# Patient Record
Sex: Female | Born: 1945 | Race: White | Hispanic: No
Health system: Southern US, Community
[De-identification: ages and names within clinical notes are randomized; demographics above are authoritative.]

## PROBLEM LIST (undated history)

## (undated) DIAGNOSIS — Z9889 Other specified postprocedural states: Secondary | ICD-10-CM

## (undated) DIAGNOSIS — J45909 Unspecified asthma, uncomplicated: Secondary | ICD-10-CM

## (undated) DIAGNOSIS — M1990 Unspecified osteoarthritis, unspecified site: Secondary | ICD-10-CM

## (undated) DIAGNOSIS — E039 Hypothyroidism, unspecified: Secondary | ICD-10-CM

## (undated) DIAGNOSIS — T8859XA Other complications of anesthesia, initial encounter: Secondary | ICD-10-CM

## (undated) DIAGNOSIS — R112 Nausea with vomiting, unspecified: Secondary | ICD-10-CM

## (undated) HISTORY — PX: CHOLECYSTECTOMY: SHX55

## (undated) HISTORY — PX: NASAL SINUS SURGERY: SHX719

## (undated) HISTORY — PX: OTHER SURGICAL HISTORY: SHX169

---

## 2019-11-23 ENCOUNTER — Ambulatory Visit: Payer: Self-pay | Attending: Internal Medicine

## 2019-11-23 DIAGNOSIS — Z23 Encounter for immunization: Secondary | ICD-10-CM

## 2019-11-23 NOTE — Progress Notes (Signed)
   Covid-19 Vaccination Clinic  Name:  Melissa Irwin    MRN: [id number] DOB: April 07, 1945  11/23/2019  [name] was observed post Covid-19 immunization for 30 minutes based on pre-vaccination screening without incident. She was provided with Vaccine Information Sheet and instruction to access the V-Safe system.   [name] was instructed to call 911 with any severe reactions post vaccine: Marland Kitchen Difficulty breathing  . Swelling of face and throat  . A fast heartbeat  . A bad rash all over body  . Dizziness and weakness

## 2020-01-28 ENCOUNTER — Other Ambulatory Visit: Payer: Self-pay | Admitting: Orthopedic Surgery

## 2020-01-28 ENCOUNTER — Other Ambulatory Visit: Payer: Self-pay | Admitting: Anesthesiology

## 2020-01-28 DIAGNOSIS — M9711XA Periprosthetic fracture around internal prosthetic right knee joint, initial encounter: Secondary | ICD-10-CM

## 2020-01-28 DIAGNOSIS — M25579 Pain in unspecified ankle and joints of unspecified foot: Secondary | ICD-10-CM

## 2020-02-03 ENCOUNTER — Other Ambulatory Visit: Payer: Self-pay | Admitting: Orthopedic Surgery

## 2020-02-03 DIAGNOSIS — M9711XA Periprosthetic fracture around internal prosthetic right knee joint, initial encounter: Secondary | ICD-10-CM

## 2020-02-16 ENCOUNTER — Other Ambulatory Visit: Payer: Self-pay

## 2020-02-16 ENCOUNTER — Ambulatory Visit
Admission: RE | Admit: 2020-02-16 | Discharge: 2020-02-16 | Disposition: A | Discharge status: Home / Self Care | Payer: Medicare Other | Source: Ambulatory Visit | Attending: Orthopedic Surgery | Admitting: Orthopedic Surgery

## 2020-02-16 DIAGNOSIS — M9711XA Periprosthetic fracture around internal prosthetic right knee joint, initial encounter: Secondary | ICD-10-CM

## 2020-04-28 ENCOUNTER — Other Ambulatory Visit (HOSPITAL_COMMUNITY): Payer: Self-pay | Admitting: Orthopedic Surgery

## 2020-04-28 ENCOUNTER — Other Ambulatory Visit: Payer: Self-pay | Admitting: Orthopedic Surgery

## 2020-04-28 DIAGNOSIS — T8484XA Pain due to internal orthopedic prosthetic devices, implants and grafts, initial encounter: Secondary | ICD-10-CM

## 2020-05-04 ENCOUNTER — Other Ambulatory Visit: Payer: Self-pay

## 2020-05-04 ENCOUNTER — Encounter (HOSPITAL_COMMUNITY)
Admission: RE | Admit: 2020-05-04 | Discharge: 2020-05-04 | Disposition: A | Discharge status: Home / Self Care | Payer: Medicare Other | Source: Ambulatory Visit | Attending: Orthopedic Surgery | Admitting: Orthopedic Surgery

## 2020-05-04 DIAGNOSIS — T8484XA Pain due to internal orthopedic prosthetic devices, implants and grafts, initial encounter: Secondary | ICD-10-CM | POA: Diagnosis not present

## 2020-05-04 MED ORDER — TECHNETIUM TC 99M MEDRONATE IV KIT
20.0000 | PACK | Freq: Once | INTRAVENOUS | Status: AC | PRN
  Administered 2020-05-04: 20.3 via INTRAVENOUS

## 2020-07-25 ENCOUNTER — Other Ambulatory Visit (HOSPITAL_COMMUNITY): Payer: Medicare Other

## 2020-07-27 NOTE — Progress Notes (Signed)
DUE TO COVID-19 ONLY ONE VISITOR IS ALLOWED TO COME WITH YOU AND STAY IN THE WAITING ROOM ONLY DURING PRE OP AND PROCEDURE DAY OF SURGERY. THE 1 VISITOR  MAY VISIT WITH YOU AFTER SURGERY IN YOUR PRIVATE ROOM DURING VISITING HOURS ONLY!  YOU NEED TO HAVE A COVID 19 TEST ON__7/18/2022 _____ @_______ , THIS TEST MUST BE DONE BEFORE SURGERY,  COVID TESTING SITE [address] [place] , IT IS ON THE RIGHT GOING OUT [address] APPROXIMATELY  2 MINUTES PAST [employer] ON THE RIGHT. ONCE YOUR COVID TEST IS COMPLETED,  PLEASE BEGIN THE QUARANTINE INSTRUCTIONS AS OUTLINED IN YOUR HANDOUT.                Melissa Irwin  07/27/2020   Your procedure is scheduled on:      08/03/2020   Report to [facility] Main  Entrance   Report to admitting at     0940am AM     Call this number if you have problems the morning of surgery [phone]    REMEMBER: NO  SOLID FOOD CANDY OR GUM AFTER MIDNIGHT. CLEAR LIQUIDS UNTIL    0910am      . NOTHING BY MOUTH EXCEPT CLEAR LIQUIDS UNTIL    0910am . PLEASE FINISH ENSURE DRINK PER SURGEON ORDER  WHICH NEEDS TO BE COMPLETED AT   0910am    .      CLEAR LIQUID DIET   Foods Allowed                                                                    Coffee and tea, regular and decaf                            Fruit ices (not with fruit pulp)                                      Iced Popsicles                                    Carbonated beverages, regular and diet                                    Cranberry, grape and apple juices Sports drinks like Gatorade Lightly seasoned clear broth or consume(fat free) Sugar, honey syrup ___________________________________________________________________      BRUSH YOUR TEETH MORNING OF SURGERY AND RINSE YOUR MOUTH OUT, NO CHEWING GUM CANDY OR MINTS.     Take these medicines the morning of surgery with A SIP OF WATER:    DO NOT TAKE ANY DIABETIC MEDICATIONS DAY OF YOUR SURGERY                                You may not have any metal on your body including hair pins and  piercings  Do not wear jewelry, make-up, lotions, powders or perfumes, deodorant             Do not wear nail polish on your fingernails.  Do not shave  48 hours prior to surgery.              Men may shave face and neck.   Do not bring valuables to the hospital. [place].  Contacts, dentures or bridgework may not be worn into surgery.  Leave suitcase in the car. After surgery it may be brought to your room.     Patients discharged the day of surgery will not be allowed to drive home. IF YOU ARE HAVING SURGERY AND GOING HOME THE SAME DAY, YOU MUST HAVE AN ADULT TO DRIVE YOU HOME AND BE WITH YOU FOR 24 HOURS. YOU MAY GO HOME BY TAXI OR UBER OR ORTHERWISE, BUT AN ADULT MUST ACCOMPANY YOU HOME AND STAY WITH YOU FOR 24 HOURS.  Name and phone number of your driver:  Special Instructions: N/A              Please read over the following fact sheets you were given: _____________________________________________________________________  [facility] - Preparing for Surgery Before surgery, you can play an important role.  Because skin is not sterile, your skin needs to be as free of germs as possible.  You can reduce the number of germs on your skin by washing with CHG (chlorahexidine gluconate) soap before surgery.  CHG is an antiseptic cleaner which kills germs and bonds with the skin to continue killing germs even after washing. Please DO NOT use if you have an allergy to CHG or antibacterial soaps.  If your skin becomes reddened/irritated stop using the CHG and inform your nurse when you arrive at Short Stay. Do not shave (including legs and underarms) for at least 48 hours prior to the first CHG shower.  You may shave your face/neck. Please follow these instructions carefully:  1.  Shower with CHG Soap the night before surgery and the  morning of  Surgery.  2.  If you choose to wash your hair, wash your hair first as usual with your  normal  shampoo.  3.  After you shampoo, rinse your hair and body thoroughly to remove the  shampoo.                           4.  Use CHG as you would any other liquid soap.  You can apply chg directly  to the skin and wash                       Gently with a scrungie or clean washcloth.  5.  Apply the CHG Soap to your body ONLY FROM THE NECK DOWN.   Do not use on face/ open                           Wound or open sores. Avoid contact with eyes, ears mouth and genitals (private parts).                       Wash face,  Genitals (private parts) with your normal soap.             6.  Wash thoroughly, paying special attention to the area where your surgery  will be performed.  7.  Thoroughly rinse your body with warm water from the neck down.  8.  DO NOT shower/wash with your normal soap after using and rinsing off  the CHG Soap.                9.  Pat yourself dry with a clean towel.            10.  Wear clean pajamas.            11.  Place clean sheets on your bed the night of your first shower and do not  sleep with pets. Day of Surgery : Do not apply any lotions/deodorants the morning of surgery.  Please wear clean clothes to the hospital/surgery center.  FAILURE TO FOLLOW THESE INSTRUCTIONS MAY RESULT IN THE CANCELLATION OF YOUR SURGERY PATIENT SIGNATURE_________________________________  NURSE SIGNATURE__________________________________  ________________________________________________________________________

## 2020-08-01 ENCOUNTER — Other Ambulatory Visit (HOSPITAL_COMMUNITY)
Admission: RE | Admit: 2020-08-01 | Discharge: 2020-08-01 | Disposition: A | Discharge status: Home / Self Care | Payer: Medicare Other | Source: Ambulatory Visit | Attending: Orthopedic Surgery | Admitting: Orthopedic Surgery

## 2020-08-01 ENCOUNTER — Other Ambulatory Visit: Payer: Self-pay

## 2020-08-01 ENCOUNTER — Encounter (HOSPITAL_COMMUNITY)
Admission: RE | Admit: 2020-08-01 | Discharge: 2020-08-01 | Disposition: A | Discharge status: Home / Self Care | Payer: Medicare Other | Source: Ambulatory Visit | Attending: Orthopedic Surgery | Admitting: Orthopedic Surgery

## 2020-08-01 ENCOUNTER — Encounter (HOSPITAL_COMMUNITY): Payer: Self-pay

## 2020-08-01 DIAGNOSIS — Z01812 Encounter for preprocedural laboratory examination: Secondary | ICD-10-CM | POA: Insufficient documentation

## 2020-08-01 DIAGNOSIS — Z20822 Contact with and (suspected) exposure to covid-19: Secondary | ICD-10-CM | POA: Insufficient documentation

## 2020-08-01 HISTORY — DX: Other complications of anesthesia, initial encounter: T88.59XA

## 2020-08-01 HISTORY — DX: Unspecified asthma, uncomplicated: J45.909

## 2020-08-01 HISTORY — DX: Unspecified osteoarthritis, unspecified site: M19.90

## 2020-08-01 HISTORY — DX: Hypothyroidism, unspecified: E03.9

## 2020-08-01 HISTORY — DX: Other specified postprocedural states: Z98.890

## 2020-08-01 HISTORY — DX: Nausea with vomiting, unspecified: R11.2

## 2020-08-01 LAB — SURGICAL PCR SCREEN
MRSA, PCR: POSITIVE — AB
Staphylococcus aureus: POSITIVE — AB

## 2020-08-01 LAB — COMPREHENSIVE METABOLIC PANEL
ALT: 16 U/L (ref 0–44)
AST: 24 U/L (ref 15–41)
Albumin: 4.2 g/dL (ref 3.5–5.0)
Alkaline Phosphatase: 117 U/L (ref 38–126)
Anion gap: 9 (ref 5–15)
BUN: 19 mg/dL (ref 8–23)
CO2: 26 mmol/L (ref 22–32)
Calcium: 9.9 mg/dL (ref 8.9–10.3)
Chloride: 101 mmol/L (ref 98–111)
Creatinine, Ser: 0.5 mg/dL (ref 0.44–1.00)
GFR, Estimated: >60 mL/min (ref >60)
Glucose, Bld: 85 mg/dL (ref 70–99)
Potassium: 4.2 mmol/L (ref 3.5–5.1)
Sodium: 136 mmol/L (ref 135–145)
Total Bilirubin: 0.7 mg/dL (ref 0.3–1.2)
Total Protein: 8.2 g/dL — ABNORMAL HIGH (ref 6.5–8.1)

## 2020-08-01 LAB — CBC
HCT: 45 % (ref 36.0–46.0)
Hemoglobin: 14.2 g/dL (ref 12.0–15.0)
MCH: 29.5 pg (ref 26.0–34.0)
MCHC: 31.6 g/dL (ref 30.0–36.0)
MCV: 93.4 fL (ref 80.0–100.0)
Platelets: 254 10*3/uL (ref 150–400)
RBC: 4.82 MIL/uL (ref 3.87–5.11)
RDW: 14.5 % (ref 11.5–15.5)
WBC: 7.6 10*3/uL (ref 4.0–10.5)
nRBC: 0 % (ref 0.0–0.2)

## 2020-08-01 LAB — PROTIME-INR
INR: 1.1 (ref 0.8–1.2)
Prothrombin Time: 14.4 seconds (ref 11.4–15.2)

## 2020-08-01 LAB — SARS CORONAVIRUS 2 (TAT 6-24 HRS): SARS Coronavirus 2: NEGATIVE

## 2020-08-01 NOTE — Progress Notes (Addendum)
Anesthesia Review:  PCP: DR Kandis Nab LOV 06/21/20  Cardiologist : none  Chest x-ray : EKG : 08/01/2020  Echo : Stress test: Cardiac Cath :  Activity level: can do a flight of stairs without difficulty  Sleep Study/ CPAP : none  Fasting Blood Sugar :      / Checks Blood Sugar -- times a day:   Blood Thinner/ Instructions /Last Dose: ASA / Instructions/ Last Dose :

## 2020-08-02 ENCOUNTER — Encounter (HOSPITAL_COMMUNITY): Payer: Self-pay | Admitting: Orthopedic Surgery

## 2020-08-02 MED ORDER — BUPIVACAINE LIPOSOME 1.3 % IJ SUSP
20.0000 mL | Freq: Once | INTRAMUSCULAR | Status: DC
  Filled 2020-08-02: qty 20

## 2020-08-02 NOTE — Anesthesia Preprocedure Evaluation (Addendum)
Anesthesia Evaluation  Patient identified by MRN, date of birth, ID band Patient awake    Reviewed: Allergy & Precautions, NPO status , Patient's Chart, lab work & pertinent test results  History of Anesthesia Complications (+) PONV  Airway Mallampati: II  TM Distance: >3 FB Neck ROM: Full    Dental  (+) Dental Advisory Given   Pulmonary sleep apnea and Continuous Positive Airway Pressure Ventilation , COPD,  COPD inhaler, former smoker,  08/01/2020 SARS coronavirus NEG   breath sounds clear to auscultation       Cardiovascular hypertension, Pt. on medications (-) angina Rhythm:Regular Rate:Normal     Neuro/Psych negative neurological ROS     GI/Hepatic Neg liver ROS, GERD  Medicated and Controlled,  Endo/Other  Hypothyroidism   Renal/GU      Musculoskeletal  (+) Arthritis ,   Abdominal   Peds  Hematology negative hematology ROS (+)   Anesthesia Other Findings   Reproductive/Obstetrics                           Anesthesia Physical Anesthesia Plan  ASA: 3  Anesthesia Plan: Spinal   Post-op Pain Management:  Regional for Post-op pain   Induction:   PONV Risk Score and Plan: 3 and Ondansetron, Dexamethasone and Treatment may vary due to age or medical condition  Airway Management Planned: Natural Airway and Simple Face Mask  Additional Equipment:   Intra-op Plan:   Post-operative Plan:   Informed Consent: I have reviewed the patients History and Physical, chart, labs and discussed the procedure including the risks, benefits and alternatives for the proposed anesthesia with the patient or authorized representative who has indicated his/her understanding and acceptance.     Dental advisory given  Plan Discussed with: CRNA and Surgeon  Anesthesia Plan Comments: (Plan routine monitors, SAB with adductor canal block for post op analgesia)       Anesthesia Quick  Evaluation

## 2020-08-02 NOTE — H&P (Addendum)
TOTAL KNEE REVISION ADMISSION H&P  Patient is being admitted for right total knee arthroplasty revision.  Subjective:  Chief Complaint: Right knee pain.  HPI: Melissa Irwin, 75 y.o. female has a history of pain and functional disability in the right knee due to  failed right TKA  and has failed non-surgical conservative treatments for greater than 12 weeks to include NSAID's and/or analgesics and activity modification. Onset of symptoms was gradual, starting  several  years ago with gradually worsening course since that time. The patient noted prior procedures on the knee to include  arthroplasty on the right knee.  Patient currently rates pain in the right knee at 6 out of 10 with activity. Patient has worsening of pain with activity and weight bearing, pain that interferes with activities of daily living, and pain with passive range of motion. AP and lateral of the right knee dated 04/28/2020 demonstrate long-stem revision components at the femur and tibia. There is evidence of an old diaphyseal fracture at mid-stem level on the femur with the stem bypassing it by approximately 5 inches. The fracture is well aligned and appears healed. She has signs that are suggestive of loosening proximally at the tibial component, but not definitive. She has similar findings at the femur.    There are no problems to display for this patient.   Past Medical History:  Diagnosis Date   Arthritis    Asthma    Complication of anesthesia    Hypothyroidism    PONV (postoperative nausea and vomiting)     Past Surgical History:  Procedure Laterality Date   CHOLECYSTECTOMY     NASAL SINUS SURGERY     x 2   right elbow surgery      right hip fracture surgery      right knee replacement      x 2   right shoulder replacement       Prior to Admission medications   Medication Sig Start Date End Date Taking? Authorizing Provider  albuterol (VENTOLIN HFA) 108 (90 Base) MCG/ACT inhaler Inhale 1-2 puffs  into the lungs every 6 (six) hours as needed for wheezing or shortness of breath.   Yes [provider]  aspirin EC 81 MG tablet Take 81 mg by mouth daily. Swallow whole.   Yes [provider]  CALCIUM PO Take 1 tablet by mouth daily.   Yes [provider]  Cholecalciferol (VITAMIN D3) 50 MCG (2000 UT) TABS Take 2,000 Units by mouth daily.   Yes [provider]  levothyroxine (SYNTHROID) 137 MCG tablet Take 137 mcg by mouth daily. 06/27/20  Yes [provider]  losartan (COZAAR) 50 MG tablet Take 50 mg by mouth daily. 06/08/20  Yes [provider]  meloxicam (MOBIC) 15 MG tablet Take 15 mg by mouth daily. 06/14/20  Yes [provider]  Multiple Vitamins-Minerals (PRESERVISION AREDS 2 PO) Take 1 tablet by mouth in the morning and at bedtime.   Yes [provider]  pantoprazole (PROTONIX) 40 MG tablet Take 40 mg by mouth daily. 06/08/20  Yes [provider]  rosuvastatin (CRESTOR) 20 MG tablet Take 20 mg by mouth at bedtime. 07/17/20  Yes [provider]  sertraline (ZOLOFT) 100 MG tablet Take 100 mg by mouth daily. 06/06/20  Yes [provider]  zolpidem (AMBIEN) 10 MG tablet Take 10 mg by mouth at bedtime as needed for sleep. 07/25/20  Yes [provider]    Allergies  Allergen Reactions   Cataflam [  Diclofenac Potassium] Anaphylaxis   Sulfa Antibiotics Hives    Social History   Socioeconomic History   Marital status: Married    Spouse name: Not on file   Number of children: Not on file   Years of education: Not on file   Highest education level: Not on file  Occupational History   Not on file  Tobacco Use   Smoking status: Former    Types: Cigarettes    Quit date: 01/16/1968    Years since quitting: 52.5   Smokeless tobacco: Never  Vaping Use   Vaping Use: Never used  Substance and Sexual Activity   Alcohol use: Yes    Comment: social   Drug use: Never   Sexual activity: Not on  file  Other Topics Concern   Not on file  Social History Narrative   ** Merged History Encounter **       Social Determinants of Health   Financial Resource Strain: Not on file  Food Insecurity: Not on file  Transportation Needs: Not on file  Physical Activity: Not on file  Stress: Not on file  Social Connections: Not on file  Intimate Partner Violence: Not on file    Tobacco Use: Medium Risk   Smoking Tobacco Use: Former   Smokeless Tobacco Use: Never   Social History   Substance and Sexual Activity  Alcohol Use Yes   Comment: social    History reviewed. No pertinent family history.  ROS: Constitutional: no fever, no chills, no night sweats, no significant weight loss Cardiovascular: no chest pain, no palpitations Respiratory: no cough, no shortness of breath, No COPD Gastrointestinal: no vomiting, no nausea Musculoskeletal: no swelling in Joints, Joint Pain Neurologic: no numbness, no tingling, no difficulty with balance   Objective:  Physical Exam: Well nourished and well developed.  General: Alert and oriented x3, cooperative and pleasant, no acute distress.  Head: normocephalic, atraumatic, neck supple.  Eyes: EOMI.  Respiratory: breath sounds clear in all fields, no wheezing, rales, or rhonchi. Cardiovascular: Regular rate and rhythm, no murmurs, gallops or rubs.  Abdomen: non-tender to palpation and soft, normoactive bowel sounds. Musculoskeletal:  Right Hip Exam:  ROM: is normal without discomfort.    Right Knee Exam:  No effusion.  No warmth.  Range of motion is 5 to 110 degrees.  Some medial joint line tenderness.  Slight lateral joint line tenderness.  The knee is stable.   Calves soft and nontender. Motor function intact in LE. Strength 5/5 LE bilaterally. Neuro: Distal pulses 2+. Sensation to light touch intact in LE.    Vital signs in last 24 hours:    Imaging Review AP and lateral of the right knee dated 04/28/2020 demonstrate  long-stem revision components at the femur and tibia. There is evidence of an old diaphyseal fracture at mid-stem level on the femur with the stem bypassing it by approximately 5 inches. The fracture is well aligned and appears healed. She has signs that are suggestive of loosening proximally at the tibial component, but not definitive. She has similar findings at the femur.   Assessment/Plan:  End stage arthritis, right knee   The patient history, physical examination, clinical judgment of the provider and imaging studies are consistent with end stage degenerative joint disease of the right knee and total knee arthroplasty is deemed medically necessary. The treatment options including medical management, injection therapy arthroscopy and arthroplasty were discussed at length. The risks and benefits of total knee arthroplasty were presented and reviewed. The risks  due to aseptic loosening, infection, stiffness, patella tracking problems, thromboembolic complications and other imponderables were discussed. The patient acknowledged the explanation, agreed to proceed with the plan and consent was signed. Patient is being admitted for inpatient treatment for surgery, pain control, PT, OT, prophylactic antibiotics, VTE prophylaxis, progressive ambulation and ADLs and discharge planning. The patient is planning to be discharged  home .   Patient's anticipated LOS is less than 2 midnights, meeting these requirements: - Lives within 1 hour of care - Has a competent adult at home to help with post-op recovery - NO history of  - Chronic pain requiring opiods  - Diabetes  - Coronary Artery Disease  - Heart failure  - Heart attack  - Stroke  - DVT/VTE  - Cardiac arrhythmia  - Respiratory Failure/COPD  - Renal failure  - Anemia  - Advanced Liver disease    Therapy Plans: [facility] (first two weeks); [facility] (Dr. Lisette Grinder [place], [place] Disposition: Home with Gerome Apley Planned  DVT Prophylaxis: Xarelto DME Needed: None PCP: Kandis Nab, MD (clearance received) TXA: IV Allergies: Sulfa, Cataflam Anesthesia Concerns: Severe nausea following surgeries in the past.  BMI: 29.2 Last HgbA1c: n/a  Pharmacy: [facility] on [facility]   - Patient was instructed on what medications to stop prior to surgery. - Follow-up visit in 2 weeks with Dr. Lequita Halt - Begin physical therapy following surgery - Pre-operative lab work as pre-surgical testing - Prescriptions will be provided in hospital at time of discharge  Nelia Shi, [facility], PA-C Orthopedic Surgery [facility] [place]

## 2020-08-03 ENCOUNTER — Encounter (HOSPITAL_COMMUNITY): Payer: Self-pay | Admitting: Orthopedic Surgery

## 2020-08-03 ENCOUNTER — Inpatient Hospital Stay (HOSPITAL_COMMUNITY): Payer: Medicare Other | Admitting: Anesthesiology

## 2020-08-03 ENCOUNTER — Inpatient Hospital Stay (HOSPITAL_COMMUNITY)
Admission: RE | Admit: 2020-08-03 | Discharge: 2020-08-05 | DRG: 468 | Disposition: A | Discharge status: Home / Self Care | Payer: Medicare Other | Attending: Orthopedic Surgery | Admitting: Orthopedic Surgery

## 2020-08-03 ENCOUNTER — Encounter (HOSPITAL_COMMUNITY)
Admission: RE | Disposition: A | Discharge status: Home / Self Care | Payer: Self-pay | Source: Home / Self Care | Attending: Orthopedic Surgery

## 2020-08-03 ENCOUNTER — Other Ambulatory Visit: Payer: Self-pay

## 2020-08-03 ENCOUNTER — Inpatient Hospital Stay (HOSPITAL_COMMUNITY): Payer: Medicare Other | Admitting: Physician Assistant

## 2020-08-03 DIAGNOSIS — Z20822 Contact with and (suspected) exposure to covid-19: Secondary | ICD-10-CM | POA: Diagnosis present

## 2020-08-03 DIAGNOSIS — E039 Hypothyroidism, unspecified: Secondary | ICD-10-CM | POA: Diagnosis present

## 2020-08-03 DIAGNOSIS — M1711 Unilateral primary osteoarthritis, right knee: Secondary | ICD-10-CM | POA: Diagnosis present

## 2020-08-03 DIAGNOSIS — Y792 Prosthetic and other implants, materials and accessory orthopedic devices associated with adverse incidents: Secondary | ICD-10-CM | POA: Diagnosis present

## 2020-08-03 DIAGNOSIS — Z96611 Presence of right artificial shoulder joint: Secondary | ICD-10-CM | POA: Diagnosis present

## 2020-08-03 DIAGNOSIS — R110 Nausea: Secondary | ICD-10-CM | POA: Diagnosis not present

## 2020-08-03 DIAGNOSIS — Z96651 Presence of right artificial knee joint: Secondary | ICD-10-CM

## 2020-08-03 DIAGNOSIS — T84092A Other mechanical complication of internal right knee prosthesis, initial encounter: Secondary | ICD-10-CM | POA: Diagnosis present

## 2020-08-03 DIAGNOSIS — Z96659 Presence of unspecified artificial knee joint: Secondary | ICD-10-CM

## 2020-08-03 DIAGNOSIS — T84018A Broken internal joint prosthesis, other site, initial encounter: Secondary | ICD-10-CM

## 2020-08-03 HISTORY — PX: TOTAL KNEE REVISION: SHX996

## 2020-08-03 LAB — TYPE AND SCREEN
ABO/RH(D): O POS
Antibody Screen: NEGATIVE

## 2020-08-03 LAB — ABO/RH: ABO/RH(D): O POS

## 2020-08-03 SURGERY — TOTAL KNEE REVISION
Anesthesia: Spinal | Site: Knee | Laterality: Right

## 2020-08-03 MED ORDER — PROMETHAZINE HCL 25 MG/ML IJ SOLN: 6.2500 mg | INTRAMUSCULAR | Status: DC | PRN

## 2020-08-03 MED ORDER — SODIUM CHLORIDE 0.9 % IV SOLN
2.0000 g | INTRAVENOUS | Status: AC
  Administered 2020-08-03: 2 g via INTRAVENOUS
  Filled 2020-08-03: qty 2

## 2020-08-03 MED ORDER — MIDAZOLAM HCL 5 MG/5ML IJ SOLN
INTRAMUSCULAR | Status: DC | PRN
  Administered 2020-08-03: 1 mg via INTRAVENOUS

## 2020-08-03 MED ORDER — PROPOFOL 500 MG/50ML IV EMUL
INTRAVENOUS | Status: DC | PRN
  Administered 2020-08-03: 50 ug/kg/min via INTRAVENOUS
  Administered 2020-08-03: 75 ug/kg/min via INTRAVENOUS

## 2020-08-03 MED ORDER — PHENYLEPHRINE HCL (PRESSORS) 10 MG/ML IV SOLN
INTRAVENOUS | Status: AC
  Filled 2020-08-03: qty 1

## 2020-08-03 MED ORDER — MEPERIDINE HCL 50 MG/ML IJ SOLN: 6.2500 mg | INTRAMUSCULAR | Status: DC | PRN

## 2020-08-03 MED ORDER — LEVOTHYROXINE SODIUM 25 MCG PO TABS
137.0000 ug | ORAL_TABLET | Freq: Every day | ORAL | Status: DC
  Administered 2020-08-04 – 2020-08-05 (×2): 137 ug via ORAL
  Filled 2020-08-03 (×2): qty 1

## 2020-08-03 MED ORDER — PANTOPRAZOLE SODIUM 40 MG PO TBEC
40.0000 mg | DELAYED_RELEASE_TABLET | Freq: Every day | ORAL | Status: DC
  Administered 2020-08-03 – 2020-08-05 (×3): 40 mg via ORAL
  Filled 2020-08-03 (×3): qty 1

## 2020-08-03 MED ORDER — OXYCODONE HCL 5 MG/5ML PO SOLN
5.0000 mg | Freq: Once | ORAL | Status: DC | PRN
Start: 2020-08-03 — End: 2020-08-03

## 2020-08-03 MED ORDER — ROSUVASTATIN CALCIUM 20 MG PO TABS
20.0000 mg | ORAL_TABLET | Freq: Every day | ORAL | Status: DC
  Administered 2020-08-03 – 2020-08-04 (×2): 20 mg via ORAL
  Filled 2020-08-03 (×2): qty 1

## 2020-08-03 MED ORDER — SODIUM CHLORIDE (PF) 0.9 % IJ SOLN
INTRAMUSCULAR | Status: AC
  Filled 2020-08-03: qty 10

## 2020-08-03 MED ORDER — FENTANYL CITRATE (PF) 100 MCG/2ML IJ SOLN
INTRAMUSCULAR | Status: AC
  Filled 2020-08-03: qty 2

## 2020-08-03 MED ORDER — 0.9 % SODIUM CHLORIDE (POUR BTL) OPTIME
TOPICAL | Status: DC | PRN
  Administered 2020-08-03: 1000 mL

## 2020-08-03 MED ORDER — ALBUTEROL SULFATE (2.5 MG/3ML) 0.083% IN NEBU: 2.5000 mg | INHALATION_SOLUTION | Freq: Four times a day (QID) | RESPIRATORY_TRACT | Status: DC | PRN

## 2020-08-03 MED ORDER — METOCLOPRAMIDE HCL 5 MG PO TABS: 5.0000 mg | ORAL_TABLET | Freq: Three times a day (TID) | ORAL | Status: DC | PRN

## 2020-08-03 MED ORDER — ALBUTEROL SULFATE HFA 108 (90 BASE) MCG/ACT IN AERS: 1.0000 | INHALATION_SPRAY | Freq: Four times a day (QID) | RESPIRATORY_TRACT | Status: DC | PRN

## 2020-08-03 MED ORDER — DEXAMETHASONE SODIUM PHOSPHATE 10 MG/ML IJ SOLN
8.0000 mg | Freq: Once | INTRAMUSCULAR | Status: AC
  Administered 2020-08-03: 7 mg via INTRAVENOUS

## 2020-08-03 MED ORDER — SODIUM CHLORIDE (PF) 0.9 % IJ SOLN
INTRAMUSCULAR | Status: DC | PRN
  Administered 2020-08-03: 60 mL

## 2020-08-03 MED ORDER — MIDAZOLAM HCL 2 MG/2ML IJ SOLN
INTRAMUSCULAR | Status: AC
  Filled 2020-08-03: qty 2

## 2020-08-03 MED ORDER — OXYCODONE HCL 5 MG PO TABS
5.0000 mg | ORAL_TABLET | ORAL | Status: DC | PRN
  Administered 2020-08-03 (×2): 5 mg via ORAL
  Administered 2020-08-04 (×4): 10 mg via ORAL
  Administered 2020-08-04: 5 mg via ORAL
  Administered 2020-08-05 (×2): 10 mg via ORAL
  Filled 2020-08-03: qty 2
  Filled 2020-08-03: qty 1
  Filled 2020-08-03 (×5): qty 2
  Filled 2020-08-03 (×4): qty 1

## 2020-08-03 MED ORDER — RIVAROXABAN 10 MG PO TABS
10.0000 mg | ORAL_TABLET | Freq: Every day | ORAL | Status: DC
  Administered 2020-08-04 – 2020-08-05 (×2): 10 mg via ORAL
  Filled 2020-08-03 (×2): qty 1

## 2020-08-03 MED ORDER — FENTANYL CITRATE (PF) 100 MCG/2ML IJ SOLN
INTRAMUSCULAR | Status: DC | PRN
  Administered 2020-08-03 (×2): 50 ug via INTRAVENOUS

## 2020-08-03 MED ORDER — BUPIVACAINE IN DEXTROSE 0.75-8.25 % IT SOLN
INTRATHECAL | Status: DC | PRN
  Administered 2020-08-03: 12 mg via INTRATHECAL

## 2020-08-03 MED ORDER — MIDAZOLAM HCL 2 MG/2ML IJ SOLN: 0.5000 mg | Freq: Once | INTRAMUSCULAR | Status: DC | PRN

## 2020-08-03 MED ORDER — ZOLPIDEM TARTRATE 10 MG PO TABS
10.0000 mg | ORAL_TABLET | Freq: Every evening | ORAL | Status: DC | PRN
  Filled 2020-08-03: qty 1

## 2020-08-03 MED ORDER — FLEET ENEMA 7-19 GM/118ML RE ENEM: 1.0000 | ENEMA | Freq: Once | RECTAL | Status: DC | PRN

## 2020-08-03 MED ORDER — LOSARTAN POTASSIUM 50 MG PO TABS
50.0000 mg | ORAL_TABLET | Freq: Every day | ORAL | Status: DC
  Administered 2020-08-04 – 2020-08-05 (×2): 50 mg via ORAL
  Filled 2020-08-03 (×2): qty 1

## 2020-08-03 MED ORDER — FENTANYL CITRATE (PF) 100 MCG/2ML IJ SOLN
50.0000 ug | INTRAMUSCULAR | Status: DC
  Administered 2020-08-03: 50 ug via INTRAVENOUS
  Filled 2020-08-03: qty 2

## 2020-08-03 MED ORDER — PROPOFOL 10 MG/ML IV BOLUS
INTRAVENOUS | Status: AC
  Filled 2020-08-03: qty 20

## 2020-08-03 MED ORDER — LACTATED RINGERS IV SOLN: INTRAVENOUS | Status: DC

## 2020-08-03 MED ORDER — DEXAMETHASONE SODIUM PHOSPHATE 10 MG/ML IJ SOLN
INTRAMUSCULAR | Status: AC
  Filled 2020-08-03: qty 1

## 2020-08-03 MED ORDER — ROPIVACAINE HCL 7.5 MG/ML IJ SOLN
INTRAMUSCULAR | Status: DC | PRN
  Administered 2020-08-03: 20 mL via PERINEURAL

## 2020-08-03 MED ORDER — SERTRALINE HCL 100 MG PO TABS
100.0000 mg | ORAL_TABLET | Freq: Every day | ORAL | Status: DC
  Administered 2020-08-03 – 2020-08-04 (×2): 100 mg via ORAL
  Filled 2020-08-03 (×2): qty 1

## 2020-08-03 MED ORDER — PHENYLEPHRINE HCL (PRESSORS) 10 MG/ML IV SOLN
INTRAVENOUS | Status: DC | PRN
  Administered 2020-08-03 (×2): 80 ug via INTRAVENOUS

## 2020-08-03 MED ORDER — STERILE WATER FOR IRRIGATION IR SOLN
Status: DC | PRN
  Administered 2020-08-03: 2000 mL

## 2020-08-03 MED ORDER — BUPIVACAINE LIPOSOME 1.3 % IJ SUSP
INTRAMUSCULAR | Status: DC | PRN
  Administered 2020-08-03: 20 mL

## 2020-08-03 MED ORDER — METOCLOPRAMIDE HCL 5 MG/ML IJ SOLN: 5.0000 mg | Freq: Three times a day (TID) | INTRAMUSCULAR | Status: DC | PRN

## 2020-08-03 MED ORDER — SODIUM CHLORIDE 0.9 % IV SOLN
2.0000 g | Freq: Four times a day (QID) | INTRAVENOUS | Status: AC
  Administered 2020-08-03 – 2020-08-04 (×2): 2 g via INTRAVENOUS
  Filled 2020-08-03 (×2): qty 2

## 2020-08-03 MED ORDER — GABAPENTIN 300 MG PO CAPS
300.0000 mg | ORAL_CAPSULE | Freq: Three times a day (TID) | ORAL | Status: DC
  Administered 2020-08-03 – 2020-08-05 (×6): 300 mg via ORAL
  Filled 2020-08-03 (×6): qty 1

## 2020-08-03 MED ORDER — OXYCODONE HCL 5 MG PO TABS: 5.0000 mg | ORAL_TABLET | Freq: Once | ORAL | Status: DC | PRN

## 2020-08-03 MED ORDER — HYDROMORPHONE HCL 1 MG/ML IJ SOLN
0.2500 mg | INTRAMUSCULAR | Status: DC | PRN
  Administered 2020-08-03 (×3): 0.5 mg via INTRAVENOUS

## 2020-08-03 MED ORDER — PROPOFOL 10 MG/ML IV BOLUS
INTRAVENOUS | Status: DC | PRN
  Administered 2020-08-03 (×3): 20 mg via INTRAVENOUS

## 2020-08-03 MED ORDER — ONDANSETRON HCL 4 MG/2ML IJ SOLN: 4.0000 mg | Freq: Four times a day (QID) | INTRAMUSCULAR | Status: DC | PRN

## 2020-08-03 MED ORDER — MIDAZOLAM HCL 2 MG/2ML IJ SOLN
1.0000 mg | INTRAMUSCULAR | Status: DC
  Administered 2020-08-03: 1 mg via INTRAVENOUS
  Filled 2020-08-03: qty 2

## 2020-08-03 MED ORDER — PHENYLEPHRINE HCL-NACL 10-0.9 MG/250ML-% IV SOLN
INTRAVENOUS | Status: DC | PRN
  Administered 2020-08-03: 25 ug/min via INTRAVENOUS

## 2020-08-03 MED ORDER — METHOCARBAMOL 500 MG IVPB - SIMPLE MED
INTRAVENOUS | Status: AC
  Filled 2020-08-03: qty 50

## 2020-08-03 MED ORDER — METHOCARBAMOL 500 MG PO TABS
500.0000 mg | ORAL_TABLET | Freq: Four times a day (QID) | ORAL | Status: DC | PRN
  Administered 2020-08-04 – 2020-08-05 (×4): 500 mg via ORAL
  Filled 2020-08-03 (×5): qty 1

## 2020-08-03 MED ORDER — HYDROMORPHONE HCL 1 MG/ML IJ SOLN: 0.5000 mg | INTRAMUSCULAR | Status: DC | PRN

## 2020-08-03 MED ORDER — PHENOL 1.4 % MT LIQD: 1.0000 | OROMUCOSAL | Status: DC | PRN

## 2020-08-03 MED ORDER — ACETAMINOPHEN 325 MG PO TABS
325.0000 mg | ORAL_TABLET | Freq: Four times a day (QID) | ORAL | Status: DC | PRN
  Administered 2020-08-03: 650 mg via ORAL
  Filled 2020-08-03: qty 2

## 2020-08-03 MED ORDER — ONDANSETRON HCL 4 MG PO TABS: 4.0000 mg | ORAL_TABLET | Freq: Four times a day (QID) | ORAL | Status: DC | PRN

## 2020-08-03 MED ORDER — PROPOFOL 1000 MG/100ML IV EMUL
INTRAVENOUS | Status: AC
  Filled 2020-08-03: qty 100

## 2020-08-03 MED ORDER — POLYETHYLENE GLYCOL 3350 17 G PO PACK: 17.0000 g | PACK | Freq: Every day | ORAL | Status: DC | PRN

## 2020-08-03 MED ORDER — ONDANSETRON HCL 4 MG/2ML IJ SOLN
INTRAMUSCULAR | Status: DC | PRN
  Administered 2020-08-03: 4 mg via INTRAVENOUS

## 2020-08-03 MED ORDER — ONDANSETRON HCL 4 MG/2ML IJ SOLN
INTRAMUSCULAR | Status: AC
  Filled 2020-08-03: qty 2

## 2020-08-03 MED ORDER — POVIDONE-IODINE 10 % EX SWAB
2.0000 "application " | Freq: Once | CUTANEOUS | Status: AC
  Administered 2020-08-03: 2 via TOPICAL

## 2020-08-03 MED ORDER — METHOCARBAMOL 500 MG IVPB - SIMPLE MED
500.0000 mg | Freq: Four times a day (QID) | INTRAVENOUS | Status: DC | PRN
  Administered 2020-08-03: 500 mg via INTRAVENOUS
  Filled 2020-08-03: qty 50

## 2020-08-03 MED ORDER — TRANEXAMIC ACID-NACL 1000-0.7 MG/100ML-% IV SOLN
1000.0000 mg | INTRAVENOUS | Status: AC
  Administered 2020-08-03: 1000 mg via INTRAVENOUS
  Filled 2020-08-03: qty 100

## 2020-08-03 MED ORDER — DEXAMETHASONE SODIUM PHOSPHATE 10 MG/ML IJ SOLN
10.0000 mg | Freq: Once | INTRAMUSCULAR | Status: AC
  Administered 2020-08-04: 10 mg via INTRAVENOUS
  Filled 2020-08-03: qty 1

## 2020-08-03 MED ORDER — BISACODYL 10 MG RE SUPP: 10.0000 mg | Freq: Every day | RECTAL | Status: DC | PRN

## 2020-08-03 MED ORDER — DIPHENHYDRAMINE HCL 12.5 MG/5ML PO ELIX
12.5000 mg | ORAL_SOLUTION | ORAL | Status: DC | PRN
Start: 2020-08-03 — End: 2020-08-05

## 2020-08-03 MED ORDER — DOCUSATE SODIUM 100 MG PO CAPS
100.0000 mg | ORAL_CAPSULE | Freq: Two times a day (BID) | ORAL | Status: DC
  Administered 2020-08-03 – 2020-08-05 (×4): 100 mg via ORAL
  Filled 2020-08-03 (×4): qty 1

## 2020-08-03 MED ORDER — ACETAMINOPHEN 10 MG/ML IV SOLN
1000.0000 mg | Freq: Four times a day (QID) | INTRAVENOUS | Status: DC
  Administered 2020-08-03: 1000 mg via INTRAVENOUS
  Filled 2020-08-03: qty 100

## 2020-08-03 MED ORDER — MENTHOL 3 MG MT LOZG: 1.0000 | LOZENGE | OROMUCOSAL | Status: DC | PRN

## 2020-08-03 MED ORDER — SODIUM CHLORIDE 0.9 % IV SOLN: INTRAVENOUS | Status: DC

## 2020-08-03 MED ORDER — TRAMADOL HCL 50 MG PO TABS
50.0000 mg | ORAL_TABLET | Freq: Four times a day (QID) | ORAL | Status: DC | PRN
  Administered 2020-08-05: 100 mg via ORAL
  Filled 2020-08-03: qty 2

## 2020-08-03 MED ORDER — HYDROMORPHONE HCL 1 MG/ML IJ SOLN
INTRAMUSCULAR | Status: AC
  Administered 2020-08-03: 0.5 mg via INTRAVENOUS
  Filled 2020-08-03: qty 2

## 2020-08-03 MED ORDER — VANCOMYCIN HCL IN DEXTROSE 1-5 GM/200ML-% IV SOLN
1000.0000 mg | INTRAVENOUS | Status: AC
  Administered 2020-08-03: 1000 mg via INTRAVENOUS
  Filled 2020-08-03: qty 200

## 2020-08-03 SURGICAL SUPPLY — 74 items
BAG COUNTER SPONGE SURGICOUNT (BAG) ×1 IMPLANT
BAG DECANTER FOR FLEXI CONT (MISCELLANEOUS) ×2 IMPLANT
BAG SPEC THK2 15X12 ZIP CLS (MISCELLANEOUS)
BAG SPNG CNTER NS LX DISP (BAG) ×1
BAG ZIPLOCK 12X15 (MISCELLANEOUS) IMPLANT
BEARING ROTATING HINGE SROM IMPLANT
BLADE FLEX CHISEL 8 2.9 (MISCELLANEOUS) ×1 IMPLANT
BLADE SAG 18X100X1.27 (BLADE) ×2 IMPLANT
BLADE SAW SGTL 11.0X1.19X90.0M (BLADE) ×2 IMPLANT
BLADE SURG SZ10 CARB STEEL (BLADE) ×4 IMPLANT
BNDG ELASTIC 6X5.8 VLCR STR LF (GAUZE/BANDAGES/DRESSINGS) ×2 IMPLANT
BONE CEMENT GENTAMICIN (Cement) ×6 IMPLANT
BRNG TIB MED 42 STRL KN ×1 IMPLANT
CEMENT BONE GENTAMICIN 40 (Cement) ×3 IMPLANT
CEMENT RESTRICTOR DEPUY SZ 3 (Cement) ×1 IMPLANT
CLOTH BEACON ORANGE TIMEOUT ST (SAFETY) ×2 IMPLANT
CLSR STERI-STRIP ANTIMIC 1/2X4 (GAUZE/BANDAGES/DRESSINGS) ×1 IMPLANT
COVER SURGICAL LIGHT HANDLE (MISCELLANEOUS) ×2 IMPLANT
CUFF TOURN SGL QUICK 34 (TOURNIQUET CUFF) ×2
CUFF TRNQT CYL 34X4.125X (TOURNIQUET CUFF) ×1 IMPLANT
DECANTER SPIKE VIAL GLASS SM (MISCELLANEOUS) ×1 IMPLANT
DRAPE U-SHAPE 47X51 STRL (DRAPES) ×2 IMPLANT
DRSG ADAPTIC 3X8 NADH LF (GAUZE/BANDAGES/DRESSINGS) ×1 IMPLANT
DRSG AQUACEL AG ADV 3.5X10 (GAUZE/BANDAGES/DRESSINGS) ×1 IMPLANT
DRSG PAD ABDOMINAL 8X10 ST (GAUZE/BANDAGES/DRESSINGS) ×1 IMPLANT
DURAPREP 26ML APPLICATOR (WOUND CARE) ×2 IMPLANT
ELECT REM PT RETURN 15FT ADLT (MISCELLANEOUS) ×2 IMPLANT
EVACUATOR 1/8 PVC DRAIN (DRAIN) ×1 IMPLANT
FEMUR  HINGE RT X SMALL (Orthopedic Implant) ×2 IMPLANT
FEMUR HINGE RT X SMALL (Orthopedic Implant) ×1 IMPLANT
FEMUR HINGED RT X SMALL (Orthopedic Implant) IMPLANT
GAUZE SPONGE 4X4 12PLY STRL (GAUZE/BANDAGES/DRESSINGS) ×2 IMPLANT
GLOVE SRG 8 PF TXTR STRL LF DI (GLOVE) ×1 IMPLANT
GLOVE SURG ENC MOIS LTX SZ6.5 (GLOVE) ×4 IMPLANT
GLOVE SURG ENC MOIS LTX SZ8 (GLOVE) ×4 IMPLANT
GLOVE SURG UNDER POLY LF SZ7 (GLOVE) ×2 IMPLANT
GLOVE SURG UNDER POLY LF SZ8 (GLOVE) ×2
GLOVE SURG UNDER POLY LF SZ8.5 (GLOVE) IMPLANT
GOWN STRL REUS W/TWL LRG LVL3 (GOWN DISPOSABLE) ×4 IMPLANT
HANDPIECE INTERPULSE COAX TIP (DISPOSABLE) ×2
HOLDER FOLEY CATH W/STRAP (MISCELLANEOUS) IMPLANT
IMMOBILIZER KNEE 20 (SOFTGOODS) ×2
IMMOBILIZER KNEE 20 THIGH 36 (SOFTGOODS) ×1 IMPLANT
KIT TURNOVER KIT A (KITS) ×2 IMPLANT
MANIFOLD NEPTUNE II (INSTRUMENTS) ×2 IMPLANT
NS IRRIG 1000ML POUR BTL (IV SOLUTION) ×2 IMPLANT
PACK TOTAL KNEE CUSTOM (KITS) ×2 IMPLANT
PADDING CAST COTTON 6X4 STRL (CAST SUPPLIES) ×4 IMPLANT
PENCIL SMOKE EVACUATOR (MISCELLANEOUS) ×1 IMPLANT
PIN STEINMAN FIXATION KNEE (PIN) ×1 IMPLANT
PROTECTOR NERVE ULNAR (MISCELLANEOUS) ×2 IMPLANT
SET HNDPC FAN SPRY TIP SCT (DISPOSABLE) ×1 IMPLANT
SLEEVE TIB MBT 53 (Knees) ×1 IMPLANT
SLEEVE UNIV FEM DIST PRO SZ 31 (Sleeve) ×1 IMPLANT
SROM ROTATING HINGE BEARING ×2 IMPLANT
STEM LPS TIB HINGED UN XS 18MM (Stem) ×1 IMPLANT
STEM TIBIA PFC 13X60MM (Stem) ×1 IMPLANT
STEM UNIV REV 115 X 6 FLUTED (Stem) ×1 IMPLANT
STRIP CLOSURE SKIN 1/2X4 (GAUZE/BANDAGES/DRESSINGS) ×3 IMPLANT
SUT MNCRL AB 4-0 PS2 18 (SUTURE) ×2 IMPLANT
SUT STRATAFIX 0 PDS 27 VIOLET (SUTURE) ×2
SUT VIC AB 2-0 CT1 27 (SUTURE) ×6
SUT VIC AB 2-0 CT1 TAPERPNT 27 (SUTURE) ×3 IMPLANT
SUTURE STRATFX 0 PDS 27 VIOLET (SUTURE) ×1 IMPLANT
SWAB COLLECTION DEVICE MRSA (MISCELLANEOUS) IMPLANT
SWAB CULTURE ESWAB REG 1ML (MISCELLANEOUS) ×1 IMPLANT
SYR 50ML LL SCALE MARK (SYRINGE) ×4 IMPLANT
TOWER CARTRIDGE SMART MIX (DISPOSABLE) ×2 IMPLANT
TRAY FOLEY MTR SLVR 16FR STAT (SET/KITS/TRAYS/PACK) ×2 IMPLANT
TRAY REVISION SZ 2 15 (Joint) ×1 IMPLANT
TUBE KAMVAC SUCTION (TUBING) IMPLANT
TUBE SUCTION HIGH CAP CLEAR NV (SUCTIONS) ×2 IMPLANT
WATER STERILE IRR 1000ML POUR (IV SOLUTION) ×2 IMPLANT
WRAP KNEE MAXI GEL POST OP (GAUZE/BANDAGES/DRESSINGS) ×1 IMPLANT

## 2020-08-03 NOTE — Anesthesia Procedure Notes (Signed)
Spinal  Start time: 08/03/2020 11:00 AM End time: 08/03/2020 11:43 AM Reason for block: surgical anesthesia Staffing Performed: anesthesiologist  Anesthesiologist: Jairo Ben, MD Preanesthetic Checklist Completed: patient identified, IV checked, site marked, risks and benefits discussed, surgical consent, monitors and equipment checked, pre-op evaluation and timeout performed Spinal Block Patient position: sitting Prep: DuraPrep and site prepped and draped Patient monitoring: blood pressure, continuous pulse ox, cardiac monitor and heart rate Approach: midline Location: L3-4 Injection technique: single-shot Needle Needle type: Quincke  Needle gauge: 22 G Needle length: 9 cm Assessment Events: CSF return and second provider Additional Notes Pt identified in Operating room.  Monitors applied. Working IV access confirmed. Sterile prep, drape lumbar spine.  CRNA 1% lido local L 3,4.  #24ga Pencan, all attempts os (Evans/Kareen Hitsman), then repeat local and 22ga Quincke into clear CSF L 3,4.  12mg  0.75% Bupivacaine with dextrose injected with asp CSF beginning and end of injection.  Patient asymptomatic, VSS, no heme aspirated, tolerated well.  , MD

## 2020-08-03 NOTE — Plan of Care (Signed)
  Problem: Education: Goal: Knowledge of General Education information will improve Description: Including pain rating scale, medication(s)/side effects and non-pharmacologic comfort measures Outcome: Progressing   Problem: Clinical Measurements: Goal: Respiratory complications will improve Outcome: Progressing   Problem: Activity: Goal: Risk for activity intolerance will decrease Outcome: Progressing   Problem: Nutrition: Goal: Adequate nutrition will be maintained Outcome: Progressing   Problem: Elimination: Goal: Will not experience complications related to bowel motility Outcome: Progressing   Problem: Pain Managment: Goal: General experience of comfort will improve Outcome: Progressing   Problem: Safety: Goal: Ability to remain free from injury will improve Outcome: Progressing   Problem: Education: Goal: Knowledge of the prescribed therapeutic regimen will improve Outcome: Progressing   Problem: Activity: Goal: Ability to avoid complications of mobility impairment will improve Outcome: Progressing   Problem: Pain Management: Goal: Pain level will decrease with appropriate interventions Outcome: Progressing

## 2020-08-03 NOTE — Progress Notes (Signed)
AssistedDr. Carswell Jackson with right, ultrasound guided, adductor canal block. Side rails up, monitors on throughout procedure. See vital signs in flow sheet. Tolerated Procedure well.  

## 2020-08-03 NOTE — Anesthesia Postprocedure Evaluation (Signed)
Anesthesia Post Note  Patient: Melissa Irwin  Procedure(s) Performed: TOTAL KNEE REVISION (Right: Knee)     Patient location during evaluation: PACU Anesthesia Type: Spinal Level of consciousness: awake and alert, patient cooperative and oriented Pain management: pain level controlled Vital Signs Assessment: post-procedure vital signs reviewed and stable Respiratory status: spontaneous breathing, nonlabored ventilation and respiratory function stable Cardiovascular status: blood pressure returned to baseline and stable Postop Assessment: no apparent nausea or vomiting, patient able to bend at knees and spinal receding Anesthetic complications: no   No notable events documented.  Last Vitals:  Vitals:   08/03/20 1600 08/03/20 1632  BP:  (!) 147/78  Pulse: 90 95  Resp: 12 18  Temp:  36.7 C  SpO2: 100% 100%    Last Pain:  Vitals:   08/03/20 1707  TempSrc:   PainSc: 6                  Tamico Mundo,E. Alis Sawchuk

## 2020-08-03 NOTE — Anesthesia Procedure Notes (Signed)
Anesthesia Regional Block: Adductor canal block   Pre-Anesthetic Checklist: , timeout performed,  Correct Patient, Correct Site, Correct Laterality,  Correct Procedure, Correct Position, site marked,  Risks and benefits discussed,  Surgical consent,  Pre-op evaluation,  At surgeon's request and post-op pain management  Laterality: Right and Lower  Prep: chloraprep       Needles:  Injection technique: Single-shot  Needle Type: Echogenic Needle     Needle Length: 9cm  Needle Gauge: 21     Additional Needles:   Procedures:,,,, ultrasound used (permanent image in chart),,    Narrative:  Start time: 08/03/2020 10:34 AM End time: 08/03/2020 10:40 AM Injection made incrementally with aspirations every 5 mL.  Performed by: Personally  Anesthesiologist: Jairo Ben, MD  Additional Notes: Pt identified in Holding room.  Monitors applied. Working IV access confirmed. Sterile prep R thigh.  #21ga ECHOgenic ARROW block needle into adductor canal with US guidance.  20cc 0.75% Ropivacaine injected incrementally after negative test dose.  Patient asymptomatic, VSS, no heme aspirated, tolerated well.   Sandford Craze, MD

## 2020-08-03 NOTE — Discharge Instructions (Addendum)
Melissa Gross, MD Total Joint Specialist [facility] [address], [address] [place], [place] [address] [phone]  POSTOPERATIVE DIRECTIONS  Knee Rehabilitation, Guidelines Following Surgery  Results after knee surgery are often greatly improved when you follow the exercise, range of motion and muscle strengthening exercises prescribed by your doctor. Safety measures are also important to protect the knee from further injury. If any of these exercises cause you to have increased pain or swelling in your knee joint, decrease the amount until you are comfortable again and slowly increase them. If you have problems or questions, call your caregiver or physical therapist for advice.   BLOOD CLOT PREVENTION Take a 325 mg Aspirin two times a day for three weeks following surgery. Then resume one 81 mg Aspirin once a day. You may resume your vitamins/supplements upon discharge from the hospital. Do not take any NSAIDs (Advil, Aleve, Ibuprofen, Meloxicam, etc.) until you have discontinued the 325 mg Aspirin.  HOME CARE INSTRUCTIONS  Remove items at home which could result in a fall. This includes throw rugs or furniture in walking pathways.  ICE to the affected knee as much as tolerated. Icing helps control swelling. If the swelling is well controlled you will be more comfortable and rehab easier. Continue to use ice on the knee for pain and swelling from surgery. You may notice swelling that will progress down to the foot and ankle. This is normal after surgery. Elevate the leg when you are not up walking on it.    Continue to use the breathing machine which will help keep your temperature down. It is common for your temperature to cycle up and down following surgery, especially at night when you are not up moving around and exerting yourself. The breathing machine keeps your lungs expanded and your temperature down. Do not place pillow under the operative knee, focus on  keeping the knee straight while resting  DIET You may resume your previous home diet once you are discharged from the hospital.  DRESSING / WOUND CARE / SHOWERING Keep your bulky bandage on for 2 days. On the third post-operative day you may remove the Ace bandage and gauze. There is a waterproof adhesive bandage on your skin which will stay in place until your first follow-up appointment. Once you remove this you will not need to place another bandage You may begin showering 3 days following surgery, but do not submerge the incision under water.  ACTIVITY For the first 5 days, the key is rest and control of pain and swelling Do your home exercises twice a day starting on post-operative day 3. On the days you go to physical therapy, just do the home exercises once that day. You should rest, ice and elevate the leg for 50 minutes out of every hour. Get up and walk/stretch for 10 minutes per hour. After 5 days you can increase your activity slowly as tolerated. Walk with your walker as instructed. Use the walker until you are comfortable transitioning to a cane. Walk with the cane in the opposite hand of the operative leg. You may discontinue the cane once you are comfortable and walking steadily. Avoid periods of inactivity such as sitting longer than an hour when not asleep. This helps prevent blood clots.  You may discontinue the knee immobilizer once you are able to perform a straight leg raise while lying down. You may resume a sexual relationship in one month or when given the OK by your doctor.  You may  return to work once you are cleared by your doctor.  Do not drive a car for 6 weeks or until released by your surgeon.  Do not drive while taking narcotics.  TED HOSE STOCKINGS Wear the elastic stockings on both legs for three weeks following surgery during the day. You may remove them at night for sleeping.  WEIGHT BEARING Weight bearing as tolerated with assist device (walker, cane,  etc) as directed, use it as long as suggested by your surgeon or therapist, typically at least 4-6 weeks.  POSTOPERATIVE CONSTIPATION PROTOCOL Constipation - defined medically as fewer than three stools per week and severe constipation as less than one stool per week.  One of the most common issues patients have following surgery is constipation.  Even if you have a regular bowel pattern at home, your normal regimen is likely to be disrupted due to multiple reasons following surgery.  Combination of anesthesia, postoperative narcotics, change in appetite and fluid intake all can affect your bowels.  In order to avoid complications following surgery, here are some recommendations in order to help you during your recovery period.  Colace (docusate) - Pick up an over-the-counter form of Colace or another stool softener and take twice a day as long as you are requiring postoperative pain medications.  Take with a full glass of water daily.  If you experience loose stools or diarrhea, hold the colace until you stool forms back up. If your symptoms do not get better within 1 week or if they get worse, check with your doctor. Dulcolax (bisacodyl) - Pick up over-the-counter and take as directed by the product packaging as needed to assist with the movement of your bowels.  Take with a full glass of water.  Use this product as needed if not relieved by Colace only.  MiraLax (polyethylene glycol) - Pick up over-the-counter to have on hand. MiraLax is a solution that will increase the amount of water in your bowels to assist with bowel movements.  Take as directed and can mix with a glass of water, juice, soda, coffee, or tea. Take if you go more than two days without a movement. Do not use MiraLax more than once per day. Call your doctor if you are still constipated or irregular after using this medication for 7 days in a row.  If you continue to have problems with postoperative constipation, please contact the  office for further assistance and recommendations.  If you experience "the worst abdominal pain ever" or develop nausea or vomiting, please contact the office immediatly for further recommendations for treatment.  ITCHING If you experience itching with your medications, try taking only a single pain pill, or even half a pain pill at a time.  You can also use Benadryl over the counter for itching or also to help with sleep.   MEDICATIONS See your medication summary on the "After Visit Summary" that the nursing staff will review with you prior to discharge.  You may have some home medications which will be placed on hold until you complete the course of blood thinner medication.  It is important for you to complete the blood thinner medication as prescribed by your surgeon.  Continue your approved medications as instructed at time of discharge.  PRECAUTIONS If you experience chest pain or shortness of breath - call 911 immediately for transfer to the hospital emergency department.  If you develop a fever greater that 101 F, purulent drainage from wound, increased redness or drainage from wound, foul  odor from the wound/dressing, or calf pain - CONTACT YOUR SURGEON.                                                   FOLLOW-UP APPOINTMENTS Make sure you keep all of your appointments after your operation with your surgeon and caregivers. You should call the office at the above phone number and make an appointment for approximately two weeks after the date of your surgery or on the date instructed by your surgeon outlined in the "After Visit Summary".  RANGE OF MOTION AND STRENGTHENING EXERCISES  Rehabilitation of the knee is important following a knee injury or an operation. After just a few days of immobilization, the muscles of the thigh which control the knee become weakened and shrink (atrophy). Knee exercises are designed to build up the tone and strength of the thigh muscles and to improve knee  motion. Often times heat used for twenty to thirty minutes before working out will loosen up your tissues and help with improving the range of motion but do not use heat for the first two weeks following surgery. These exercises can be done on a training (exercise) mat, on the floor, on a table or on a bed. Use what ever works the best and is most comfortable for you Knee exercises include:  Leg Lifts - While your knee is still immobilized in a splint or cast, you can do straight leg raises. Lift the leg to 60 degrees, hold for 3 sec, and slowly lower the leg. Repeat 10-20 times 2-3 times daily. Perform this exercise against resistance later as your knee gets better.  Quad and Hamstring Sets - Tighten up the muscle on the front of the thigh (Quad) and hold for 5-10 sec. Repeat this 10-20 times hourly. Hamstring sets are done by pushing the foot backward against an object and holding for 5-10 sec. Repeat as with quad sets.  Leg Slides: Lying on your back, slowly slide your foot toward your buttocks, bending your knee up off the floor (only go as far as is comfortable). Then slowly slide your foot back down until your leg is flat on the floor again. Angel Wings: Lying on your back spread your legs to the side as far apart as you can without causing discomfort.  A rehabilitation program following serious knee injuries can speed recovery and prevent re-injury in the future due to weakened muscles. Contact your doctor or a physical therapist for more information on knee rehabilitation.   POST-OPERATIVE OPIOID TAPER INSTRUCTIONS: It is important to wean off of your opioid medication as soon as possible. If you do not need pain medication after your surgery it is ok to stop day one. Opioids include: Codeine, Hydrocodone(Norco, Vicodin), Oxycodone(Percocet, oxycontin) and hydromorphone amongst others.  Long term and even short term use of opiods can cause: Increased pain  response Dependence Constipation Depression Respiratory depression And more.  Withdrawal symptoms can include Flu like symptoms Nausea, vomiting And more Techniques to manage these symptoms Hydrate well Eat regular healthy meals Stay active Use relaxation techniques(deep breathing, meditating, yoga) Do Not substitute Alcohol to help with tapering If you have been on opioids for less than two weeks and do not have pain than it is ok to stop all together.  Plan to wean off of opioids This plan should start within one  week post op of your joint replacement. Maintain the same interval or time between taking each dose and first decrease the dose.  Cut the total daily intake of opioids by one tablet each day Next start to increase the time between doses. The last dose that should be eliminated is the evening dose.   IF YOU ARE TRANSFERRED TO A SKILLED REHAB FACILITY If the patient is transferred to a skilled rehab facility following release from the hospital, a list of the current medications will be sent to the facility for the patient to continue.  When discharged from the skilled rehab facility, please have the facility set up the patient's Home Health Physical Therapy prior to being released. Also, the skilled facility will be responsible for providing the patient with their medications at time of release from the facility to include their pain medication, the muscle relaxants, and their blood thinner medication. If the patient is still at the rehab facility at time of the two week follow up appointment, the skilled rehab facility will also need to assist the patient in arranging follow up appointment in our office and any transportation needs.  MAKE SURE YOU:  Understand these instructions.  Get help right away if you are not doing well or get worse.   DENTAL ANTIBIOTICS:  In most cases prophylactic antibiotics for Dental procdeures after total joint surgery are not  necessary.  Exceptions are as follows:  1. History of prior total joint infection  2. Severely immunocompromised (Organ Transplant, cancer chemotherapy, Rheumatoid biologic meds such as Humera)  3. Poorly controlled diabetes (A1C &gt; 8.0, blood glucose over 200)  If you have one of these conditions, contact your surgeon for an antibiotic prescription, prior to your dental procedure.    Pick up stool softner and laxative for home use following surgery while on pain medications. Do not submerge incision under water. Please use good hand washing techniques while changing dressing each day. May shower starting three days after surgery. Please use a clean towel to pat the incision dry following showers. Continue to use ice for pain and swelling after surgery. Do not use any lotions or creams on the incision until instructed by your surgeon.  Information on my medicine - XARELTO (Rivaroxaban)  Why was Xarelto prescribed for you? Xarelto was prescribed for you to reduce the risk of blood clots forming after orthopedic surgery. The medical term for these abnormal blood clots is venous thromboembolism (VTE).  What do you need to know about xarelto ? Take your Xarelto ONCE DAILY at the same time every day. You may take it either with or without food.  If you have difficulty swallowing the tablet whole, you may crush it and mix in applesauce just prior to taking your dose.  Take Xarelto exactly as prescribed by your doctor and DO NOT stop taking Xarelto without talking to the doctor who prescribed the medication.  Stopping without other VTE prevention medication to take the place of Xarelto may increase your risk of developing a clot.  After discharge, you should have regular check-up appointments with your healthcare provider that is prescribing your Xarelto.    What do you do if you miss a dose? If you miss a dose, take it as soon as you remember on the same day then continue  your regularly scheduled once daily regimen the next day. Do not take two doses of Xarelto on the same day.   Important Safety Information A possible side effect of Xarelto is bleeding.  You should call your healthcare provider right away if you experience any of the following: Bleeding from an injury or your nose that does not stop. Unusual colored urine (red or dark brown) or unusual colored stools (red or black). Unusual bruising for unknown reasons. A serious fall or if you hit your head (even if there is no bleeding).  Some medicines may interact with Xarelto and might increase your risk of bleeding while on Xarelto. To help avoid this, consult your healthcare provider or pharmacist prior to using any new prescription or non-prescription medications, including herbals, vitamins, non-steroidal anti-inflammatory drugs (NSAIDs) and supplements.  This website has more information on Xarelto: VisitDestination.com.br.

## 2020-08-03 NOTE — Interval H&P Note (Signed)
History and Physical Interval Note:  08/03/2020 9:52 AM  Melissa Irwin  has presented today for surgery, with the diagnosis of Failed right total knee arthroplasty.  The various methods of treatment have been discussed with the patient and family. After consideration of risks, benefits and other options for treatment, the patient has consented to  Procedure(s) with comments: TOTAL KNEE REVISION (Right) - as a surgical intervention.  The patient's history has been reviewed, patient examined, no change in status, stable for surgery.  I have reviewed the patient's chart and labs.  Questions were answered to the patient's satisfaction.     Melissa Irwin

## 2020-08-03 NOTE — Brief Op Note (Signed)
08/03/2020  2:12 PM  PATIENT:  Antonieta Iba  75 y.o. female  PRE-OPERATIVE DIAGNOSIS:  Failed right total knee arthroplasty  POST-OPERATIVE DIAGNOSIS:  Failed right total knee arthroplasty  PROCEDURE:  Procedure(s) with comments: TOTAL KNEE REVISION (Right) -  SURGEON:  Surgeon(s) and Role:    Ollen Gross, MD - Primary  PHYSICIAN ASSISTANT:   ASSISTANTS: Arther Abbott, PA-C   ANESTHESIA:   spinal and adductor canal block  EBL:  50 mL   BLOOD ADMINISTERED:none  DRAINS: none   LOCAL MEDICATIONS USED:  OTHER Exparel  COUNTS:  YES  TOURNIQUET:   Total Tourniquet Time Documented: Thigh (Right) - 83 minutes Thigh (Right) - 24 minutes Total: Thigh (Right) - 107 minutes   DICTATION: .Other Dictation: Dictation Number [id number]  PLAN OF CARE: Admit to inpatient   PATIENT DISPOSITION:  PACU - hemodynamically stable.

## 2020-08-03 NOTE — Transfer of Care (Signed)
Immediate Anesthesia Transfer of Care Note  Patient: Melissa Irwin  Procedure(s) Performed: TOTAL KNEE REVISION (Right: Knee)  Patient Location: PACU  Anesthesia Type:Spinal  Level of Consciousness: awake, alert , oriented and patient cooperative  Airway & Oxygen Therapy: Patient Spontanous Breathing and Patient connected to face mask oxygen  Post-op Assessment: Report given to RN, Post -op Vital signs reviewed and stable and Patient moving all extremities X 4  Post vital signs: stable  Last Vitals:  Vitals Value Taken Time  BP 150/92 08/03/20 1445  Temp    Pulse 78 08/03/20 1446  Resp 17 08/03/20 1446  SpO2 100 % 08/03/20 1446  Vitals shown include unvalidated device data.  Last Pain:  Vitals:   08/03/20 1018  TempSrc:   PainSc: 0-No pain         Complications: No notable events documented.

## 2020-08-03 NOTE — Evaluation (Signed)
Physical Therapy Evaluation Patient Details Name: Melissa Irwin MRN: [id number] DOB: 02-19-1945 Today's Date: 08/03/2020   History of Present Illness  Pt is 75 yo female admitted with failed R TKA and is now s/p R TKA revision on 08/03/20.  She has hx including arthritis, asthma, PONV, R hip fx repair, R TKA x 2, R TSA.  Clinical Impression  Pt is s/p R TKA revision (this is her 2nd revision) resulting in the deficits listed below (see PT Problem List). At baseline, pt is independent with ADLs/IADLs, but only able to ambulated limited community distances with a cane due to knee pain.  She resides with her wife, Myriam Jacobson, who is unable to provide physical assist, but Helen's son is staying for a few days to help at discharge.  Pt is familiar with the process of therapy and verbalized understanding of importance of therapy and precautions/recommendations associated with TKA.  Today, She had good pain control, quad activation, and ROM.  She was able to sit EOB with min guard for safety but unable to progress to standing due to nausea/lethargy/"woozy".  Expected to progress well.  Pt will benefit from skilled PT to increase their independence and safety with mobility to allow discharge to the venue listed below.      Follow Up Recommendations Follow surgeon's recommendation for DC plan and follow-up therapies;Supervision for mobility/OOB    Equipment Recommendations  None recommended by PT    Recommendations for Other Services       Precautions / Restrictions Precautions Precautions: Fall Restrictions Weight Bearing Restrictions: Yes RLE Weight Bearing: Weight bearing as tolerated      Mobility  Bed Mobility Overal bed mobility: Needs Assistance Bed Mobility: Supine to Sit;Sit to Supine     Supine to sit: Min guard Sit to supine: Min guard        Transfers     Transfers: Lateral/Scoot Transfers          Lateral/Scoot Transfers: Supervision General transfer comment: Unable  to stand due to feeling nauseated; pt did laterally scoot toward [facility]  Ambulation/Gait                Stairs            Wheelchair Mobility    Modified Rankin (Stroke Patients Only)       Balance Overall balance assessment: Needs assistance Sitting-balance support: No upper extremity supported Sitting balance-Leahy Scale: Good         Standing balance comment: unable due to nausea today                             Pertinent Vitals/Pain Pain Assessment: 0-10 Pain Score: 6  Pain Location: R knee Pain Descriptors / Indicators: Discomfort;Sore Pain Intervention(s): Limited activity within patient's tolerance;Monitored during session;Ice applied;Premedicated before session    Home Living Family/patient expects to be discharged to:: Private residence Living Arrangements: Spouse/significant other (Lives with wife Myriam Jacobson who is 57 yo and can't assist, but her son is staying to help a couple days) Available Help at Discharge: Available 24 hours/day Type of Home: House Home Access: Level entry     Home Layout: Able to live on main level with bedroom/bathroom;Two level Home Equipment: Walker - 2 wheels;Cane - single point;Shower seat      Prior Function Level of Independence: Independent with assistive device(s)   Gait / Transfers Assistance Needed: Pt ambulated with cane; could do short community ambulation  ADL's /  Homemaking Assistance Needed: Independent with ADLs and IADLs        Hand Dominance        Extremity/Trunk Assessment   Upper Extremity Assessment Upper Extremity Assessment: Overall WFL for tasks assessed    Lower Extremity Assessment Lower Extremity Assessment: LLE deficits/detail;RLE deficits/detail RLE Deficits / Details: Expected post op changes.  ROM: ankle and hip WFL, knee 0 to 70 degrees; MMT: ankle 5/5, hip and knee 3/5 not further tested RLE Sensation: WNL LLE Deficits / Details: ROM WNL; MMT 5/5 LLE Sensation:  WNL    Cervical / Trunk Assessment Cervical / Trunk Assessment: Normal  Communication   Communication: No difficulties  Cognition Arousal/Alertness: Awake/alert Behavior During Therapy: WFL for tasks assessed/performed Overall Cognitive Status: Within Functional Limits for tasks assessed                                        General Comments      Exercises Total Joint Exercises Ankle Circles/Pumps: AROM;Both;10 reps;Seated Long Arc Quad: AROM;Right;10 reps;Seated Knee Flexion: AROM;Right;Seated;5 reps   Assessment/Plan    PT Assessment Patient needs continued PT services  PT Problem List Decreased strength;Decreased mobility;Decreased range of motion;Decreased activity tolerance;Decreased balance;Decreased knowledge of use of DME;Pain       PT Treatment Interventions DME instruction;Therapeutic activities;Modalities;Gait training;Therapeutic exercise;Patient/family education;Balance training;Functional mobility training;Stair training    PT Goals (Current goals can be found in the Care Plan section)  Acute Rehab PT Goals Patient Stated Goal: return home PT Goal Formulation: With patient Time For Goal Achievement: 08/17/20 Potential to Achieve Goals: Good    Frequency 7X/week   Barriers to discharge        Co-evaluation               AM-PAC PT "6 Clicks" Mobility  Outcome Measure Help needed turning from your back to your side while in a flat bed without using bedrails?: A Little Help needed moving from lying on your back to sitting on the side of a flat bed without using bedrails?: A Little Help needed moving to and from a bed to a chair (including a wheelchair)?: A Little Help needed standing up from a chair using your arms (e.g., wheelchair or bedside chair)?: A Little Help needed to walk in hospital room?: A Little Help needed climbing 3-5 steps with a railing? : A Little 6 Click Score: 18    End of Session Equipment Utilized During  Treatment: Gait belt Activity Tolerance: Other (comment) (limited by nausea and lethargy) Patient left: in bed;with call bell/phone within reach;with bed alarm set;with SCD's reapplied Nurse Communication: Mobility status PT Visit Diagnosis: Other abnormalities of gait and mobility (R26.89);Muscle weakness (generalized) (M62.81)    Time: 1025-8527 PT Time Calculation (min) (ACUTE ONLY): 20 min   Charges:   PT Evaluation $PT Eval Low Complexity: 1 Low          Ayrabella Labombard, PT Acute Rehab Services Pager [phone] [facility] [phone]   Rayetta Humphrey 08/03/2020, 5:53 PM

## 2020-08-03 NOTE — Anesthesia Procedure Notes (Signed)
Procedure Name: MAC Date/Time: 08/03/2020 11:19 AM Performed by: Lissa Morales, CRNA Pre-anesthesia Checklist: Patient identified, Emergency Drugs available, Suction available and Patient being monitored Patient Re-evaluated:Patient Re-evaluated prior to induction Oxygen Delivery Method: Simple face mask Placement Confirmation: positive ETCO2

## 2020-08-03 NOTE — Progress Notes (Signed)
Orthopedic Tech Progress Note Patient Details:  Melissa Irwin 09/25/1945 [id number]  Patient ID: Melissa Irwin, female   DOB: 03/05/1945, 75 y.o.   MRN: [id number]  [name] 08/03/2020, 3:21 PM Cpm applied in pacu

## 2020-08-03 NOTE — Op Note (Signed)
NAME: Melissa Irwin, Melissa Irwin MEDICAL RECORD NO: [id number] ACCOUNT NO: [id number] DATE OF BIRTH: [birth year] FACILITY: [facility] LOCATION: [facility] PHYSICIAN: Gus Rankin. Lakeesha Fontanilla, MD  Operative Report   DATE OF PROCEDURE: 08/03/2020  PREOPERATIVE DIAGNOSIS:  Failed right total knee arthroplasty.  POSTOPERATIVE DIAGNOSIS:  Failed right total knee arthroplasty.  PROCEDURE:  Right total knee arthroplasty revision.  SURGEON:  Gus Rankin. Chloee Tena, MD  ASSISTANT:  Arther Abbott, PA-C  ANESTHESIA:  Adductor canal block and spinal.  ESTIMATED BLOOD LOSS:  50.  DRAINS:  None.  TOURNIQUET TIME: Up 83 minutes at 300 mmHg, down 8 minutes, up additional 24 minutes at 300 mmHg.  COMPLICATIONS:  None.  CONDITION:  Stable to recovery.  BRIEF CLINICAL NOTE:  The patient is a 75 year old female who has had a right total knee arthroplasty revision done several years ago.  She presented to me with a painful unstable right total knee.  Infection workup was negative.  She had evidence of  loosening and presents now for total knee arthroplasty revision.  PROCEDURE IN DETAIL:  After successful administration of adductor canal block and spinal anesthetic, a tourniquet was placed on her right thigh.  Right lower extremity was prepped and draped in the usual sterile fashion.  Extremity was wrapped in  Esmarch, knee flexed, and tourniquet inflated to 300 mmHg.  Midline incision was made with 10 blade through subcutaneous tissue to the level of the extensor mechanism.  Fresh blade was used to make a medial parapatellar arthrotomy.  There was minimal  fluid in the joint and sent for stat Gram stain, which ended up being negative.  It was also sent for aerobic and anaerobic cultures.  The soft tissue over the proximal medial tibia was then subperiosteally elevated at the joint line with a knife and  into the semimembranosus bursa with a Cobb elevator.  Soft tissue laterally was elevated with attention being paid to  avoiding patellar tendon on the tibial tubercle.  I did a thorough excision of the scar tissue under the extensor mechanism.  I was able  to evert the patella and flex the knee 90 degrees.  We removed the locking pin for the tibial polyethylene and removed the tibial polyethylene from the tibial tray.  Circumferential retraction was obtained and tibia subluxed forward.  Oscillating saw  was used to disrupt the interface between the tibial component and bone and then tibial component was removed.  The tibial component disassociated from the stem, which was still intact in the canal.  Flexible osteotomes were used to disrupt the interface  around the stem and then a vise grip was attached to the top portion of the stem and the stem was subsequently extracted.  We then reamed the tibial canal up to 13 mm.  Extramedullary tibial alignment guide was then placed proximally at the medial  aspect of the tibial tubercle, distally on the second metatarsal axis and tibial crest.  Block was pinned to remove about 2 mm off of the tibial surface and oscillating saw was used to cut the surface.  Bone quality proximally was not great.  I knew we  would have to use a sleeve, so we started broaching for the sleeves and broached up to a 53 sleeve, which had great torsional fit and filled up the most of the metaphysis.  We trialed a size 2 build up MBT tray, which was a 15 mm thick tray, and there  was no overhang over the tibia.  We then disrupted the interface between  the femoral component and bone using osteotomes.  I was able to extract the femoral component with minimal bone loss.  The femoral stem came out with the component.  There was a pedestal at the area where the tip  of the stem existed, so I drilled through that.  We then reamed up to 16 mm to get an excellent press-fit at femoral canal.  She had significant instability preop and her epicondyles were incompetent, so I needed to do it hinged.  So, I started  broaching  for the Noiles hinge.  We broached with the 31 for a 31 sleeve and that had excellent stability to rotational stability torque and had excellent fill of the metaphyseal region.  We then attached the alignment rod to this and we cut 2 mm off the distal  femur in 7 degrees of valgus with the hinged implant.  I then attached the anterior and chamfer block and made anterior and chamfer cuts.  Intercondylar block was then placed for the intercondylar cut.  We trialed the extra small femur with a 31 sleeve  and a 16 x 110 stem extension.  This had great fit in the femur.  On the tibial side, we trialed the 53 sleeve with the size 2, 15 mm thick tray as well as a 13 x 60 stem extension.  All the components fit extremely well.  We went up to a 18 mm thickness  for the insert, which allowed for full extension and great balance throughout full range of motion.  The patella component was intact, but it was covered with bone and with soft tissue, so patelloplasty was performed to allow for the patellar component  to be exposed and the patellar component to track normally in the trochlear groove.  We then let the tourniquet down for a total of 8 minutes while the components were assembled on the back table.  Once the components were assembled, then the leg was rewrapped in Esmarch and tourniquet reinflated to 300 mmHg.  The cement restrictor for the tibia was trialed, size 3 was most appropriate, and it was placed at the appropriate depth in the tibial  canal.  The cement was mixed, and once ready for implantation, it was injected into the tibial canal just in the diaphyseal region because we had a press-fit porous coated sleeve, so the cement was below the sleeve.  We then impacted the tibial  component, which once again was a size 2, 15 mm thick tray with a 53 sleeve and a 13 x 60 stem extension.  This was impacted with excellent fit.  On the femoral side, we just cemented distally. It was an extra small  hinged femur with the 31 sleeve and  the 16 x 110 stem extension.  This was impacted also with excellent fit.  Trial 18 insert was placed.  Knee held in full extension.  All extruded cement was removed.  When the cement had fully hardened, then permanent 18 mm hinged polyethylene for the  extra small femur was placed and the knee was reduced.  We placed a locking pin and locked it into position.  The wound was then copiously irrigated with saline solution.  20 mL of Exparel with 60 mL of saline were injected into the extensor mechanism,  periosteum of the femur, and subcutaneous tissues.  Further irrigation was performed, and the arthrotomy was closed with a running 0 Stratafix suture.  Tourniquet was released for a second tourniquet time of 24 minutes.  Subcutaneous was then closed with  interrupted 2-0 Vicryl, subcuticular running 4-0 Monocryl.  Incision was cleaned and dried, and Steri-Strips and sterile dressing applied.  She was then awakened and transported to recovery in stable condition.  Note that a surgical assistant was of medical necessity for this procedure to do it in a safe and expeditious manner.  Surgical assistant was necessary for retraction of vital ligaments and neurovascular structures and for proper positioning of the limb  for removal of the old implant and for safe and accurate placement of the new implant.   ROH D: 08/03/2020 2:22:12 pm T: 08/03/2020 3:26:00 pm  JOB: [id number]/ [id number]

## 2020-08-04 LAB — BASIC METABOLIC PANEL
Anion gap: 6 (ref 5–15)
BUN: 13 mg/dL (ref 8–23)
CO2: 27 mmol/L (ref 22–32)
Calcium: 8.7 mg/dL — ABNORMAL LOW (ref 8.9–10.3)
Chloride: 105 mmol/L (ref 98–111)
Creatinine, Ser: 0.49 mg/dL (ref 0.44–1.00)
GFR, Estimated: >60 mL/min (ref >60)
Glucose, Bld: 121 mg/dL — ABNORMAL HIGH (ref 70–99)
Potassium: 4.2 mmol/L (ref 3.5–5.1)
Sodium: 138 mmol/L (ref 135–145)

## 2020-08-04 LAB — CBC
HCT: 36.1 % (ref 36.0–46.0)
Hemoglobin: 11.3 g/dL — ABNORMAL LOW (ref 12.0–15.0)
MCH: 30.1 pg (ref 26.0–34.0)
MCHC: 31.3 g/dL (ref 30.0–36.0)
MCV: 96 fL (ref 80.0–100.0)
Platelets: 211 10*3/uL (ref 150–400)
RBC: 3.76 MIL/uL — ABNORMAL LOW (ref 3.87–5.11)
RDW: 14.2 % (ref 11.5–15.5)
WBC: 12 10*3/uL — ABNORMAL HIGH (ref 4.0–10.5)
nRBC: 0 % (ref 0.0–0.2)

## 2020-08-04 NOTE — Progress Notes (Signed)
Physical Therapy Treatment Patient Details Name: Melissa Irwin MRN: [id number] DOB: 06-May-1945 Today's Date: 08/04/2020    History of Present Illness Pt is 75 yo female admitted with failed R TKA and is now s/p R TKA revision on 08/03/20.  She has hx including arthritis, asthma, PONV, R hip fx repair, R TKA x 2, R TSA.    PT Comments    POD #1: AM session Assisted pt OOB to ambulate. Pt required min assist to come to standing at EOB and ambulate to bathroom. Pt demonstrated a controlled descent to commode. Pt required Mod assist+2 to rise from commode due to pt's inability to use right arm on wall rail to assist due to prior should replacement and shoulder discomfort. During ambulation back into room patient stated her pain level at 10/10. Returned pt to recliner and LE TE focused on ROM and strength. Pt with 1 near LOB during standing and turning at chair. Cues needed to keep walker on floor and for safe turning. RN notified that home equipment provided was incorrect and pt in need of youth RW.  Plan to see pt again this afternoon to continue gait training for safe D/C to home.    Follow Up Recommendations  Follow surgeon's recommendation for DC plan and follow-up therapies;Supervision for mobility/OOB     Equipment Recommendations  Rolling walker with 5" wheels (YOUTH walker)    Recommendations for Other Services       Precautions / Restrictions Precautions Precautions: Fall Restrictions Weight Bearing Restrictions: No RLE Weight Bearing: Weight bearing as tolerated    Mobility  Bed Mobility Overal bed mobility: Needs Assistance Bed Mobility: Supine to Sit     Supine to sit: Min guard     General bed mobility comments: Pt able to self assis to being RLE to EOB    Transfers   Equipment used: Rolling walker (2 wheeled) Transfers: Sit to/from Stand Sit to Stand: Min assist;Mod assist;+2 physical assistance         General transfer comment: Min assist to stand from  recliner and bed. Mod assist +2 to stand from coomode in bathroom due to inabilty to pull on wall rail with R arm due to past R shoulder replacement  Ambulation/Gait Ambulation/Gait assistance: Min guard Gait Distance (Feet): 20 Feet Assistive device: Rolling walker (2 wheeled) Gait Pattern/deviations: Step-to pattern;Decreased step length - right;Decreased step length - left;Decreased stance time - right;Antalgic     General Gait Details: amb within room to bathrrom and back to recliner. Amb distance limited due to pt c/o of increased pain and feelings of knee buckling. cues needed to keep walker in contact with ground at all times and for sequence. Cues needed for safe turning before coming to sit.   Stairs             Wheelchair Mobility    Modified Rankin (Stroke Patients Only)       Balance Overall balance assessment: Needs assistance Sitting-balance support: No upper extremity supported Sitting balance-Leahy Scale: Good     Standing balance support: Bilateral upper extremity supported Standing balance-Leahy Scale: Poor                              Cognition Arousal/Alertness: Awake/alert Behavior During Therapy: WFL for tasks assessed/performed Overall Cognitive Status: Within Functional Limits for tasks assessed  Exercises Total Joint Exercises Ankle Circles/Pumps: AROM;Both;10 reps;Seated Quad Sets: AROM;Right;5 reps;Supine Short Arc Quad: AROM;Right;5 reps;Supine Heel Slides: AROM;AAROM;Right;10 reps;Supine Hip ABduction/ADduction: AROM;Right;5 reps;Supine Straight Leg Raises: AROM;AAROM;5 reps;Supine    General Comments        Pertinent Vitals/Pain Pain Assessment: 0-10 Pain Score: 8  Pain Location: R knee when ambulating Pain Descriptors / Indicators: Discomfort;Sore;Guarding Pain Intervention(s): Limited activity within patient's tolerance;Monitored during session;Ice applied     Home Living                      Prior Function            PT Goals (current goals can now be found in the care plan section) Acute Rehab PT Goals Patient Stated Goal: return home PT Goal Formulation: With patient Time For Goal Achievement: 08/17/20 Potential to Achieve Goals: Good    Frequency    7X/week      PT Plan      Co-evaluation              AM-PAC PT "6 Clicks" Mobility   Outcome Measure  Help needed turning from your back to your side while in a flat bed without using bedrails?: A Little Help needed moving from lying on your back to sitting on the side of a flat bed without using bedrails?: A Little Help needed moving to and from a bed to a chair (including a wheelchair)?: A Little Help needed standing up from a chair using your arms (e.g., wheelchair or bedside chair)?: A Little Help needed to walk in hospital room?: A Little Help needed climbing 3-5 steps with a railing? : A Little 6 Click Score: 18    End of Session Equipment Utilized During Treatment: Gait belt Activity Tolerance: Patient tolerated treatment well;Patient limited by pain Patient left: in chair;with call bell/phone within reach;with chair alarm set Nurse Communication: Mobility status PT Visit Diagnosis: Other abnormalities of gait and mobility (R26.89);Muscle weakness (generalized) (M62.81)     Time: 4128-7867 PT Time Calculation (min) (ACUTE ONLY): 24 min  Charges:  $Gait Training: 8-22 mins $Therapeutic Exercise: 8-22 mins                     Alma Friendly, PTA Student  Acute Rehabilitation Services Pager : [phone] Office : [phone]    Alma Friendly 08/04/2020, 11:55 AM

## 2020-08-04 NOTE — Plan of Care (Signed)
  Problem: Education: Goal: Knowledge of General Education information will improve Description Including pain rating scale, medication(s)/side effects and non-pharmacologic comfort measures Outcome: Progressing   Problem: Activity: Goal: Risk for activity intolerance will decrease Outcome: Progressing   Problem: Safety: Goal: Ability to remain free from injury will improve Outcome: Progressing   

## 2020-08-04 NOTE — Progress Notes (Signed)
   Subjective: 1 Day Post-Op Procedure(s) (LRB): TOTAL KNEE REVISION (Right) Patient reports pain as mild.   Patient seen in rounds by Dr. Lequita Halt. Patient is well, and has had no acute complaints or problems. Denies SOB, chest pain, or calf pain. No acute overnight events. Was unable to ambulate with therapy yesterday due to nausea/lethargy, but was able to sit at EOB and demonstrated good quad activation and ROM. Will continue therapy today.    Objective: Vital signs in last 24 hours: Temp:  [97.7 F (36.5 C)-98.5 F (36.9 C)] 98.4 F (36.9 C) (07/21 0507) Pulse Rate:  [66-95] 66 (07/21 0507) Resp:  [8-18] 16 (07/21 0507) BP: (112-161)/(65-92) 137/70 (07/21 0507) SpO2:  [96 %-100 %] 99 % (07/21 0507) Weight:  [69.9 kg] 69.9 kg (07/20 1018)  Intake/Output from previous day:  Intake/Output Summary (Last 24 hours) at 08/04/2020 0728 Last data filed at 08/04/2020 8416 Gross per 24 hour  Intake 3762.68 ml  Output 1625 ml  Net 2137.68 ml     Intake/Output this shift: No intake/output data recorded.  Labs: Recent Labs    08/01/20 1140 08/04/20 0252  HGB 14.2 11.3*   Recent Labs    08/01/20 1140 08/04/20 0252  WBC 7.6 12.0*  RBC 4.82 3.76*  HCT 45.0 36.1  PLT 254 211   Recent Labs    08/01/20 1140 08/04/20 0252  NA 136 138  K 4.2 4.2  CL 101 105  CO2 26 27  BUN 19 13  CREATININE 0.50 0.49  GLUCOSE 85 121*  CALCIUM 9.9 8.7*   Recent Labs    08/01/20 1140  INR 1.1    Exam: General - Patient is Alert and Oriented Extremity - Neurologically intact Neurovascular intact Intact pulses distally Dorsiflexion/Plantar flexion intact Dressing - dressing C/D/I Motor Function - intact, moving foot and toes well on exam.   Past Medical History:  Diagnosis Date   Arthritis    Asthma    Complication of anesthesia    Hypothyroidism    PONV (postoperative nausea and vomiting)     Assessment/Plan: 1 Day Post-Op Procedure(s) (LRB): TOTAL KNEE REVISION  (Right) Principal Problem:   Failed total knee arthroplasty (HCC) Active Problems:   Status post revision of total knee replacement, right  Estimated body mass index is 28.17 kg/m as calculated from the following:   Height as of this encounter: 5\' 2"  (1.575 m).   Weight as of this encounter: 69.9 kg. Up with therapy   Patient's anticipated LOS is less than 2 midnights, meeting these requirements: - Lives within 1 hour of care - Has a competent adult at home to recover with post-op - NO history of  - Chronic pain requiring opioids  - Diabetes  - Coronary Artery Disease  - Heart failure  - Heart attack  - Stroke  - DVT/VTE  - Cardiac arrhythmia  - Respiratory Failure/COPD  - Renal failure  - Anemia  - Advanced Liver disease     DVT Prophylaxis - Xarelto and TED hose Weight bearing as tolerated. Continue therapy.  Plan is to go Home after hospital stay.  Plan for two sessions with PT today. Plan for probable discharge tomorrow if continuing to progress.   Patient to follow up in two weeks with Dr. in clinic.   The PDMP database was reviewed today prior to any opioid medications being prescribed to this patient.Lequita Halt, MBA, PA-C Orthopedic Surgery 08/04/2020, 7:28 AM

## 2020-08-04 NOTE — TOC Transition Note (Signed)
Transition of Care [facility]) - CM/SW Discharge Note   Patient Details  Name: Melissa Irwin MRN: [id number] Date of Birth: [birth year]  Transition of [facility] CM/SW Contact:  Lennart Pall, LCSW Phone Number: 08/04/2020, 10:26 AM   Clinical Narrative:    Met with pt to review dc needs. Pt reports need for a rolling walker - ordered via [place].  Plan for OPPT at [facility].  No further TOC needs.   Final next level of care: OP Rehab Barriers to Discharge: No Barriers Identified   Patient Goals and CMS Choice Patient states their goals for this hospitalization and ongoing recovery are:: return home      Discharge Placement                       Discharge Plan and Services                DME Arranged: Walker rolling DME Agency: [employer] Date DME Agency Contacted: 08/04/20 Time DME Agency Contacted: [id number] Representative spoke with at DME Agency: [place] ([place] Interventions     Readmission Risk Interventions No flowsheet data found.

## 2020-08-04 NOTE — Progress Notes (Signed)
Physical Therapy Treatment Patient Details Name: Melissa Irwin MRN: [id number] DOB: [birth year] Today's Date: 08/04/2020    History of Present Illness Pt is 75 yo female admitted with failed R TKA and is now s/p R TKA revision on 08/03/20.  She has hx including arthritis, asthma, PONV, R hip fx repair, R TKA x 2, R TSA.    PT Comments    POD # 1 pm session Pt just got back to bed from using bathroom so performed TKR TE's while in bed followed by ICE.  [place] here in [place] for her OP PT sessions.  She has a BSC but did need a new Youth RW (hers was old).      Follow Up Recommendations  Follow surgeon's recommendation for DC plan and follow-up therapies;Supervision for mobility/OOB     Equipment Recommendations  Rolling walker with 5" wheels (youth walker, HAS a BSC)    Recommendations for Other Services       Precautions / Restrictions Precautions Precautions: Fall Precaution Comments: instructed no pillow under knee Restrictions Weight Bearing Restrictions: No RLE Weight Bearing: Weight bearing as tolerated    Mobility  Bed Mobility Overal bed mobility: Needs Assistance Bed Mobility: Supine to Sit     Supine to sit: Min guard     General bed mobility comments: Pt able to self assis to being RLE to EOB    Transfers   Equipment used: Rolling walker (2 wheeled) Transfers: Sit to/from Stand Sit to Stand: Min assist;Mod assist;+2 physical assistance         General transfer comment: Min assist to stand from recliner and bed. Mod assist +2 to stand from coomode in bathroom due to inabilty to pull on wall rail with R arm due to past R shoulder replacement  Ambulation/Gait Ambulation/Gait assistance: Min guard Gait Distance (Feet): 20 Feet Assistive device: Rolling walker (2 wheeled) Gait Pattern/deviations: Step-to pattern;Decreased step length - right;Decreased step length - left;Decreased stance time -  right;Antalgic     General Gait Details: amb within room to bathrrom and back to recliner. Amb distance limited due to pt c/o of increased pain and feelings of knee buckling. cues needed to keep walker in contact with ground at all times and for sequence. Cues needed for safe turning before coming to sit.   Stairs             Wheelchair Mobility    Modified Rankin (Stroke Patients Only)       Balance Overall balance assessment: Needs assistance Sitting-balance support: No upper extremity supported Sitting balance-Leahy Scale: Good     Standing balance support: Bilateral upper extremity supported Standing balance-Leahy Scale: Poor                              Cognition Arousal/Alertness: Awake/alert Behavior During Therapy: WFL for tasks assessed/performed Overall Cognitive Status: Within Functional Limits for tasks assessed                                 General Comments: AxO x 3 very pleasant Indep lady "this is my third surgery on this knee"      Exercises Total Joint Exercises Ankle Circles/Pumps: AROM;Both;10 reps;Seated Quad Sets: AROM;Right;10reps;Supine Short Arc Quad: AROM;Right;10reps;Supine Heel Slides: AROM;AAROM;Right;10 reps;Supine Hip ABduction/ADduction: AROM;Right;10reps;Supine Straight Leg Raises: AROM;AAROM;10reps;Supine    General Comments  Pertinent Vitals/Pain Pain Assessment: 0-10 Pain Score: 5  Pain Location: R knee with TE's Pain Descriptors / Indicators: Discomfort;Sore;Guarding Pain Intervention(s): Monitored during session;Premedicated before session;Repositioned;Ice applied    Home Living                      Prior Function            PT Goals (current goals can now be found in the care plan section) Acute Rehab PT Goals Patient Stated Goal: return home PT Goal Formulation: With patient Time For Goal Achievement: 08/17/20 Potential to Achieve Goals: Good Progress towards PT  goals: Progressing toward goals    Frequency    7X/week      PT Plan Current plan remains appropriate    Co-evaluation              AM-PAC PT "6 Clicks" Mobility   Outcome Measure  Help needed turning from your back to your side while in a flat bed without using bedrails?: A Little Help needed moving from lying on your back to sitting on the side of a flat bed without using bedrails?: A Little Help needed moving to and from a bed to a chair (including a wheelchair)?: A Little Help needed standing up from a chair using your arms (e.g., wheelchair or bedside chair)?: A Little Help needed to walk in hospital room?: A Little Help needed climbing 3-5 steps with a railing? : A Little 6 Click Score: 18    End of Session Equipment Utilized During Treatment: Gait belt Activity Tolerance: Patient tolerated treatment well Patient left: in bed;with bed alarm set;with call bell/phone within reach Nurse Communication: Mobility status PT Visit Diagnosis: Other abnormalities of gait and mobility (R26.89);Muscle weakness (generalized) (M62.81)     Time: 1325-1350 PT Time Calculation (min) (ACUTE ONLY): 25 min  Charges:  $Gait Training: 8-22 mins $Therapeutic Exercise: 23-37 mins                     {Allyana Vogan  PTA Acute  Rehabilitation Services Pager      [phone] Office      [phone]

## 2020-08-05 ENCOUNTER — Encounter (HOSPITAL_COMMUNITY): Payer: Self-pay | Admitting: Orthopedic Surgery

## 2020-08-05 ENCOUNTER — Other Ambulatory Visit (HOSPITAL_COMMUNITY): Payer: Self-pay

## 2020-08-05 LAB — BASIC METABOLIC PANEL
Anion gap: 6 (ref 5–15)
BUN: 11 mg/dL (ref 8–23)
CO2: 28 mmol/L (ref 22–32)
Calcium: 8.7 mg/dL — ABNORMAL LOW (ref 8.9–10.3)
Chloride: 102 mmol/L (ref 98–111)
Creatinine, Ser: 0.47 mg/dL (ref 0.44–1.00)
GFR, Estimated: >60 mL/min (ref >60)
Glucose, Bld: 124 mg/dL — ABNORMAL HIGH (ref 70–99)
Potassium: 3.5 mmol/L (ref 3.5–5.1)
Sodium: 136 mmol/L (ref 135–145)

## 2020-08-05 LAB — CBC
HCT: 33.4 % — ABNORMAL LOW (ref 36.0–46.0)
Hemoglobin: 10.8 g/dL — ABNORMAL LOW (ref 12.0–15.0)
MCH: 30.5 pg (ref 26.0–34.0)
MCHC: 32.3 g/dL (ref 30.0–36.0)
MCV: 94.4 fL (ref 80.0–100.0)
Platelets: 182 10*3/uL (ref 150–400)
RBC: 3.54 MIL/uL — ABNORMAL LOW (ref 3.87–5.11)
RDW: 14.5 % (ref 11.5–15.5)
WBC: 10.2 10*3/uL (ref 4.0–10.5)
nRBC: 0 % (ref 0.0–0.2)

## 2020-08-05 MED ORDER — METHOCARBAMOL 500 MG PO TABS
500.0000 mg | ORAL_TABLET | Freq: Four times a day (QID) | ORAL | 0 refills | Status: AC | PRN
  Filled 2020-08-05: qty 40, 10d supply, fill #0

## 2020-08-05 MED ORDER — GABAPENTIN 300 MG PO CAPS
ORAL_CAPSULE | ORAL | 0 refills | Status: AC
  Filled 2020-08-05: qty 84, 42d supply, fill #0

## 2020-08-05 MED ORDER — OXYCODONE HCL 5 MG PO TABS
5.0000 mg | ORAL_TABLET | Freq: Four times a day (QID) | ORAL | 0 refills | Status: AC | PRN
  Filled 2020-08-05: qty 42, 5d supply, fill #0

## 2020-08-05 MED ORDER — TRAMADOL HCL 50 MG PO TABS
50.0000 mg | ORAL_TABLET | Freq: Four times a day (QID) | ORAL | 0 refills | Status: AC | PRN
  Filled 2020-08-05: qty 40, 5d supply, fill #0

## 2020-08-05 MED ORDER — RIVAROXABAN 10 MG PO TABS
10.0000 mg | ORAL_TABLET | Freq: Every day | ORAL | 0 refills | Status: AC
  Filled 2020-08-05: qty 20, 20d supply, fill #0

## 2020-08-05 NOTE — Progress Notes (Signed)
Physical Therapy Treatment Patient Details Name: Melissa Irwin MRN: [id number] DOB: 1945-10-24 Today's Date: 08/05/2020    History of Present Illness Pt is 75 yo female admitted with failed R TKA and is now s/p R TKA revision on 08/03/20.  She has hx including arthritis, asthma, PONV, R hip fx repair, R TKA x 2, R TSA.    PT Comments    Assisted pt OOB to ambulate in hallway. Pt making good progress as demonstrated by less assistance needed for bed mobility and increased ambulation distance. Min guard to mobilize RLE to EOB and come to sitting, min assist for sit to/front stand with cues for hand placement. Pt required min guard to ambulate 40 feet in hallway. No c/o of dizziness or nausea. Pt returned to recliner in room. LE TE focused on ROM and strength. Addressed all mobility questions, discussed appropriate activity, educated on use of ICE.  Pt ready for D/C to home.   Follow Up Recommendations  Follow surgeon's recommendation for DC plan and follow-up therapies;Supervision for mobility/OOB     Equipment Recommendations  Rolling walker with 5" wheels    Recommendations for Other Services       Precautions / Restrictions Precautions Precautions: Fall Precaution Comments: instructed no pillow under knee Restrictions Weight Bearing Restrictions: No RLE Weight Bearing: Weight bearing as tolerated    Mobility  Bed Mobility Overal bed mobility: Needs Assistance Bed Mobility: Supine to Sit     Supine to sit: Min guard     General bed mobility comments: Pt able to self assis to being RLE to EOB    Transfers   Equipment used: Rolling walker (2 wheeled) Transfers: Sit to/from Stand Sit to Stand: Min assist         General transfer comment: Min assist to stand from EOB, cues for hand placement  Ambulation/Gait Ambulation/Gait assistance: Min guard Gait Distance (Feet): 50 Feet Assistive device: Rolling walker (2 wheeled) Gait Pattern/deviations: Step-to  pattern;Decreased step length - right;Decreased step length - left;Decreased stance time - right;Antalgic Gait velocity: decr   General Gait Details: min guard to ambulate n in hallway. SOme verbal cues for RW proximity, foot flat on floor and sequence.   Stairs             Wheelchair Mobility    Modified Rankin (Stroke Patients Only)       Balance Overall balance assessment: Needs assistance Sitting-balance support: No upper extremity supported Sitting balance-Leahy Scale: Good     Standing balance support: Bilateral upper extremity supported Standing balance-Leahy Scale: Poor                              Cognition Arousal/Alertness: Awake/alert Behavior During Therapy: WFL for tasks assessed/performed Overall Cognitive Status: Within Functional Limits for tasks assessed                                        Exercises Total Joint Exercises Quad Sets: AROM;Right;5 reps;Supine Short Arc Quad: AROM;Right;5 reps;Supine;AAROM Heel Slides: AROM;AAROM;Right;Supine;5 reps Hip ABduction/ADduction: AROM;Right;Supine;10 reps Straight Leg Raises: AAROM;5 reps;Supine    General Comments        Pertinent Vitals/Pain Pain Assessment: 0-10 Pain Score: 8  Pain Location: R knee with ambulation Pain Descriptors / Indicators: Discomfort;Sore;Guarding Pain Intervention(s): Limited activity within patient's tolerance;Monitored during session;Ice applied    Home Living  Prior Function            PT Goals (current goals can now be found in the care plan section) Acute Rehab PT Goals Patient Stated Goal: return home PT Goal Formulation: With patient Time For Goal Achievement: 08/17/20 Potential to Achieve Goals: Good Progress towards PT goals: Progressing toward goals    Frequency    7X/week      PT Plan Current plan remains appropriate    Co-evaluation              AM-PAC PT "6 Clicks"  Mobility   Outcome Measure  Help needed turning from your back to your side while in a flat bed without using bedrails?: A Little Help needed moving from lying on your back to sitting on the side of a flat bed without using bedrails?: A Little Help needed moving to and from a bed to a chair (including a wheelchair)?: A Little Help needed standing up from a chair using your arms (e.g., wheelchair or bedside chair)?: A Little Help needed to walk in hospital room?: A Little Help needed climbing 3-5 steps with a railing? : A Little 6 Click Score: 18    End of Session Equipment Utilized During Treatment: Gait belt Activity Tolerance: Patient tolerated treatment well Patient left: in chair;with call bell/phone within reach;with chair alarm set Nurse Communication: Mobility status PT Visit Diagnosis: Other abnormalities of gait and mobility (R26.89);Muscle weakness (generalized) (M62.81)     Time: 3536-1443 PT Time Calculation (min) (ACUTE ONLY): 24 min  Charges:  $Gait Training: 8-22 mins $Therapeutic Exercise: 8-22 mins                     Alma Friendly, PTA Student  Acute Rehabilitation Services Pager : [phone] Office : [phone]    Alma Friendly 08/05/2020, 10:16 AM

## 2020-08-05 NOTE — Progress Notes (Signed)
   Subjective: 2 Days Post-Op Procedure(s) (LRB): TOTAL KNEE REVISION (Right) Patient reports pain as mild.   Patient seen in rounds by Dr. Lequita Halt. Patient is well, and has had no acute complaints or problems. Denies SOB, chest pain, or calf pain. No acute overnight events. Ambulated 20 feet with therapy yesterday. Will continue therapy today.   Plan is to go Home after hospital stay.  Objective: Vital signs in last 24 hours: Temp:  [98.5 F (36.9 C)-99.8 F (37.7 C)] 98.6 F (37 C) (07/22 0514) Pulse Rate:  [78-88] 88 (07/22 0514) Resp:  [16-18] 16 (07/22 0514) BP: (90-141)/(63-77) 141/77 (07/22 0514) SpO2:  [96 %-98 %] 98 % (07/22 0514)  Intake/Output from previous day:  Intake/Output Summary (Last 24 hours) at 08/05/2020 0753 Last data filed at 08/05/2020 0719 Gross per 24 hour  Intake 1944.83 ml  Output 1800 ml  Net 144.83 ml    Intake/Output this shift: Total I/O In: -  Out: 300 [Urine:300]  Labs: Recent Labs    08/04/20 0252 08/05/20 0308  HGB 11.3* 10.8*   Recent Labs    08/04/20 0252 08/05/20 0308  WBC 12.0* 10.2  RBC 3.76* 3.54*  HCT 36.1 33.4*  PLT 211 182   Recent Labs    08/04/20 0252 08/05/20 0308  NA 138 136  K 4.2 3.5  CL 105 102  CO2 27 28  BUN 13 11  CREATININE 0.49 0.47  GLUCOSE 121* 124*  CALCIUM 8.7* 8.7*   No results for input(s): LABPT, INR in the last 72 hours.  Exam: General - Patient is Alert and Oriented Extremity - Neurologically intact Neurovascular intact Intact pulses distally Dorsiflexion/Plantar flexion intact Dressing/Incision - clean, dry, no drainage Motor Function - intact, moving foot and toes well on exam.   Past Medical History:  Diagnosis Date   Arthritis    Asthma    Complication of anesthesia    Hypothyroidism    PONV (postoperative nausea and vomiting)     Assessment/Plan: 2 Days Post-Op Procedure(s) (LRB): TOTAL KNEE REVISION (Right) Principal Problem:   Failed total knee arthroplasty  (HCC) Active Problems:   Status post revision of total knee replacement, right  Estimated body mass index is 28.17 kg/m as calculated from the following:   Height as of this encounter: 5\' 2"  (1.575 m).   Weight as of this encounter: 69.9 kg. Up with therapy  DVT Prophylaxis - Xarelto and TED hose Weight-bearing as tolerated  Plan for one session with PT this morning, and if meeting goals, will plan for discharge this afternoon.   Patient to follow up in two weeks with Dr. in clinic.   The PDMP database was reviewed today prior to any opioid medications being prescribed to this patient.Lequita Halt, MBA, PA-C Orthopedic Surgery 08/05/2020, 7:53 AM

## 2020-08-05 NOTE — Progress Notes (Signed)
Patient discharged to home w/ family. Given all belongings, instructions, equipment. Verbalized understanding of all instructions. Escorted to pov via w/c. 

## 2020-08-08 LAB — AEROBIC/ANAEROBIC CULTURE W GRAM STAIN (SURGICAL/DEEP WOUND)
Culture: NO GROWTH
Gram Stain: NONE SEEN

## 2020-08-17 NOTE — Discharge Summary (Signed)
Physician Discharge Summary   Patient ID: Melissa Irwin MRN: [id number] DOB/AGE: Feb 18, 1945 75 y.o.  Admit date: 08/03/2020 Discharge date: 08/05/2020  Primary Diagnosis: s/p right total knee arthroplasty revision   Admission Diagnoses:  Past Medical History:  Diagnosis Date   Arthritis    Asthma    Complication of anesthesia    Hypothyroidism    PONV (postoperative nausea and vomiting)    Discharge Diagnoses:   Principal Problem:   Failed total knee arthroplasty (HCC) Active Problems:   Status post revision of total knee replacement, right  Estimated body mass index is 28.17 kg/m as calculated from the following:   Height as of this encounter: 5\' 2"  (1.575 m).   Weight as of this encounter: 69.9 kg.  Procedure:  Procedure(s) (LRB): TOTAL KNEE REVISION (Right)   Consults: None  HPI: The patient is a 75 year old female who has had a right total knee arthroplasty revision done several years ago.  She presented to me with a painful unstable right total knee.  Infection workup was negative.  She had evidence of loosening and presents now for total knee arthroplasty revision.  Laboratory Data: Admission on 08/03/2020, Discharged on 08/05/2020  Component Date Value Ref Range Status   ABO/RH(D) 08/03/2020    Final                   Value:O POS Performed at [facility], [address], [place], [place]    Specimen Description 08/03/2020    Final                   Value:KNEE RIGHT Performed at [facility], [address] [place], [place]    Special Requests 08/03/2020    Final                   Value:NONE Performed at [facility], [address] [place], [place]    Gram Stain 08/03/2020    Final                   Value:NO WBC SEEN NO ORGANISMS SEEN    Culture 08/03/2020    Final                   Value:No growth aerobically or anaerobically. Performed at [facility], [address], [place], [place]    Report Status 08/03/2020 08/08/2020 FINAL   Final   WBC 08/04/2020 12.0 (A) 4.0 - 10.5 K/uL Final   RBC 08/04/2020 3.76 (A) 3.87 - 5.11 MIL/uL Final   Hemoglobin 08/04/2020 11.3 (A) 12.0 - 15.0 g/dL Final   HCT 08/06/2020 36.1  36.0 - 46.0 % Final   MCV 08/04/2020 96.0  80.0 - 100.0 fL Final   MCH 08/04/2020 30.1  26.0 - 34.0 pg Final   MCHC 08/04/2020 31.3  30.0 - 36.0 g/dL Final   RDW 08/06/2020 14.2  11.5 - 15.5 % Final   Platelets 08/04/2020 211  150 - 400 K/uL Final   nRBC 08/04/2020 0.0  0.0 - 0.2 % Final   Performed at [facility], [address], [place], [place]   Sodium 08/04/2020 138  135 - 145 mmol/L Final   Potassium 08/04/2020 4.2  3.5 - 5.1 mmol/L Final   Chloride 08/04/2020 105  98 - 111 mmol/L Final   CO2 08/04/2020 27  22 - 32 mmol/L Final   Glucose, Bld  08/04/2020 121 (A) 70 - 99 mg/dL Final   Glucose reference range applies only to samples taken after fasting for at least 8 hours.   BUN 08/04/2020 13  8 - 23 mg/dL Final   Creatinine, Ser 08/04/2020 0.49  0.44 - 1.00 mg/dL Final   Calcium 82/95/6213 8.7 (A) 8.9 - 10.3 mg/dL Final   GFR, Estimated 08/04/2020 >60  >60 mL/min Final   Comment: (NOTE) Calculated using the CKD-EPI Creatinine Equation (2021)    Anion gap 08/04/2020 6  5 - 15 Final   Performed at [facility], [address] [place], [place] [address]   WBC 08/05/2020 10.2  4.0 - 10.5 K/uL Final   RBC 08/05/2020 3.54 (A) 3.87 - 5.11 MIL/uL Final   Hemoglobin 08/05/2020 10.8 (A) 12.0 - 15.0 g/dL Final   HCT 84/69/6295 33.4 (A) 36.0 - 46.0 % Final   MCV 08/05/2020 94.4  80.0 - 100.0 fL Final   MCH 08/05/2020 30.5  26.0 - 34.0 pg Final   MCHC 08/05/2020 32.3  30.0 - 36.0 g/dL Final   RDW 28/41/3244 14.5  11.5 - 15.5 % Final   Platelets 08/05/2020 182  150 - 400 K/uL Final   nRBC 08/05/2020 0.0  0.0 - 0.2 % Final   Performed at [facility],  [address], [place], [place] [address]   Sodium 08/05/2020 136  135 - 145 mmol/L Final   Potassium 08/05/2020 3.5  3.5 - 5.1 mmol/L Final   Chloride 08/05/2020 102  98 - 111 mmol/L Final   CO2 08/05/2020 28  22 - 32 mmol/L Final   Glucose, Bld 08/05/2020 124 (A) 70 - 99 mg/dL Final   Glucose reference range applies only to samples taken after fasting for at least 8 hours.   BUN 08/05/2020 11  8 - 23 mg/dL Final   Creatinine, Ser 08/05/2020 0.47  0.44 - 1.00 mg/dL Final   Calcium 25/36/6440 8.7 (A) 8.9 - 10.3 mg/dL Final   GFR, Estimated 08/05/2020 >60  >60 mL/min Final   Comment: (NOTE) Calculated using the CKD-EPI Creatinine Equation (2021)    Anion gap 08/05/2020 6  5 - 15 Final   Performed at [facility], [address], [place], [place] [address]  Hospital Outpatient Visit on 08/01/2020  Component Date Value Ref Range Status   SARS Coronavirus 2 08/01/2020 NEGATIVE  NEGATIVE Final   Comment: (NOTE) SARS-CoV-2 target nucleic acids are NOT DETECTED.  The SARS-CoV-2 RNA is generally detectable in upper and lower respiratory specimens during the acute phase of infection. Negative results do not preclude SARS-CoV-2 infection, do not rule out co-infections with other pathogens, and should not be used as the sole basis for treatment or other patient management decisions. Negative results must be combined with clinical observations, patient history, and epidemiological information. The expected result is Negative.  Fact Sheet for Patients: HairSlick.no  Fact Sheet for Healthcare Providers: quierodirigir.com  This test is not yet approved or cleared by the [place] FDA and  has been authorized for detection and/or diagnosis of SARS-CoV-2 by FDA under an Emergency Use Authorization (EUA). This EUA will remain  in effect (meaning this test can be used) for the duration of the COVID-19 declaration  under Se                          ction 564(b)(1) of the Act, 21 U.S.C. section 360bbb-3(b)(1), unless the authorization is terminated or revoked  sooner.  Performed at [facility], [address] [place], [place] [address]   Hospital Outpatient Visit on 08/01/2020  Component Date Value Ref Range Status   MRSA, PCR 08/01/2020 POSITIVE (A) NEGATIVE Final   Comment: RESULT CALLED TO, READ BACK BY AND VERIFIED WITH: PHILLIPS,C. RN  ON 07.18.2022 BY COHEN,K    Staphylococcus aureus 08/01/2020 POSITIVE (A) NEGATIVE Final   Comment: (NOTE) The Xpert SA Assay (FDA approved for NASAL specimens in patients 13 years of age and older), is one component of a comprehensive surveillance program. It is not intended to diagnose infection nor to guide or monitor treatment. Performed at [facility], [address] [place], [place] [address]    WBC 08/01/2020 7.6  4.0 - 10.5 K/uL Final   RBC 08/01/2020 4.82  3.87 - 5.11 MIL/uL Final   Hemoglobin 08/01/2020 14.2  12.0 - 15.0 g/dL Final   HCT 09/81/1914 45.0  36.0 - 46.0 % Final   MCV 08/01/2020 93.4  80.0 - 100.0 fL Final   MCH 08/01/2020 29.5  26.0 - 34.0 pg Final   MCHC 08/01/2020 31.6  30.0 - 36.0 g/dL Final   RDW 78/29/5621 14.5  11.5 - 15.5 % Final   Platelets 08/01/2020 254  150 - 400 K/uL Final   nRBC 08/01/2020 0.0  0.0 - 0.2 % Final   Performed at [facility], [address], [place], [place] [address]   Sodium 08/01/2020 136  135 - 145 mmol/L Final   Potassium 08/01/2020 4.2  3.5 - 5.1 mmol/L Final   Chloride 08/01/2020 101  98 - 111 mmol/L Final   CO2 08/01/2020 26  22 - 32 mmol/L Final   Glucose, Bld 08/01/2020 85  70 - 99 mg/dL Final   Glucose reference range applies only to samples taken after fasting for at least 8 hours.   BUN 08/01/2020 19  8 - 23 mg/dL Final   Creatinine, Ser 08/01/2020 0.50  0.44 - 1.00 mg/dL Final   Calcium 78/46/9629 9.9  8.9 - 10.3 mg/dL Final   Total  Protein 08/01/2020 8.2 (A) 6.5 - 8.1 g/dL Final   Albumin 52/84/1324 4.2  3.5 - 5.0 g/dL Final   AST 40/10/2723 24  15 - 41 U/L Final   ALT 08/01/2020 16  0 - 44 U/L Final   Alkaline Phosphatase 08/01/2020 117  38 - 126 U/L Final   Total Bilirubin 08/01/2020 0.7  0.3 - 1.2 mg/dL Final   GFR, Estimated 08/01/2020 >60  >60 mL/min Final   Comment: (NOTE) Calculated using the CKD-EPI Creatinine Equation (2021)    Anion gap 08/01/2020 9  5 - 15 Final   Performed at [facility], [address] [place], [place] [address]   Prothrombin Time 08/01/2020 14.4  11.4 - 15.2 seconds Final   INR 08/01/2020 1.1  0.8 - 1.2 Final   Comment: (NOTE) INR goal varies based on device and disease states. Performed at [facility], [address], [place], [place] [address]    ABO/RH(D) 08/01/2020 O POS   Final   Antibody Screen 08/01/2020 NEG   Final   Sample Expiration 08/01/2020 08/06/2020,2359   Final   Extend sample reason 08/01/2020    Final                   Value:NO TRANSFUSIONS OR PREGNANCY IN THE PAST 3 MONTHS Performed at [facility], [address], [place], [place] [address]  X-Rays:No results found.  EKG: Orders placed or performed in visit on 08/01/20   EKG 12-Lead   EKG 12-Lead   EKG 12-Lead     Hospital Course: Melissa Irwin is a 75 y.o. who was admitted to [facility]. They were brought to the operating room on 08/03/2020 and underwent Procedure(s): TOTAL KNEE REVISION.  Patient tolerated the procedure well and was later transferred to the recovery room and then to the orthopaedic floor for postoperative care. They were given PO and IV analgesics for pain control following their surgery. They were given 24 hours of postoperative antibiotics of  Anti-infectives (From admission, onward)    Start     Dose/Rate Route Frequency Ordered Stop   08/03/20 1700  ceFAZolin (ANCEF) 2 g in sodium chloride 0.9 % 100 mL  IVPB        2 g 200 mL/hr over 30 Minutes Intravenous Every 6 hours 08/03/20 1635 08/04/20 0053   08/03/20 0945  ceFAZolin (ANCEF) 2 g in sodium chloride 0.9 % 100 mL IVPB        2 g 200 mL/hr over 30 Minutes Intravenous On call to O.R. 08/03/20 0941 08/03/20 1116   08/03/20 0945  vancomycin (VANCOCIN) IVPB 1000 mg/200 mL premix        1,000 mg 200 mL/hr over 60 Minutes Intravenous On call to O.R. 08/03/20 0941 08/03/20 1110      and started on DVT prophylaxis in the form of Xarelto and TED hose.   PT and OT were ordered for total joint protocol. Discharge planning consulted to help with postop disposition and equipment needs. Patient had an uneventful night on the evening of surgery. They started to get up OOB with therapy on 08/03/2020. Continued to work with therapy into POD #2. Pt was seen during rounds on day two and was ready to go home pending progress with therapy. Pt worked with therapy for two additional sessions and was meeting their goals. She was discharged to home later that day in stable condition.  Diet: Regular diet Activity: WBAT Follow-up: in two weeks Disposition: Home Discharged Condition: good   Discharge Instructions     Call MD / Call 911   Complete by: As directed    If you experience chest pain or shortness of breath, CALL 911 and be transported to the hospital emergency room.  If you develope a fever above 101 F, pus (white drainage) or increased drainage or redness at the wound, or calf pain, call your surgeon's office.   Change dressing   Complete by: As directed    You may remove the bulky bandage (ACE wrap and gauze) two days after surgery. You will have an adhesive waterproof bandage underneath. Leave this in place until your first follow-up appointment.   Constipation Prevention   Complete by: As directed    Drink plenty of fluids.  Prune juice may be helpful.  You may use a stool softener, such as Colace (over the counter) 100 mg twice a day.  Use  MiraLax (over the counter) for constipation as needed.   Diet - low sodium heart healthy   Complete by: As directed    Do not put a pillow under the knee. Place it under the heel.   Complete by: As directed    Driving restrictions   Complete by: As directed    No driving for two weeks   Post-operative opioid taper instructions:   Complete by: As directed    POST-OPERATIVE OPIOID TAPER  INSTRUCTIONS: It is important to wean off of your opioid medication as soon as possible. If you do not need pain medication after your surgery it is ok to stop day one. Opioids include: Codeine, Hydrocodone(Norco, Vicodin), Oxycodone(Percocet, oxycontin) and hydromorphone amongst others.  Long term and even short term use of opiods can cause: Increased pain response Dependence Constipation Depression Respiratory depression And more.  Withdrawal symptoms can include Flu like symptoms Nausea, vomiting And more Techniques to manage these symptoms Hydrate well Eat regular healthy meals Stay active Use relaxation techniques(deep breathing, meditating, yoga) Do Not substitute Alcohol to help with tapering If you have been on opioids for less than two weeks and do not have pain than it is ok to stop all together.  Plan to wean off of opioids This plan should start within one week post op of your joint replacement. Maintain the same interval or time between taking each dose and first decrease the dose.  Cut the total daily intake of opioids by one tablet each day Next start to increase the time between doses. The last dose that should be eliminated is the evening dose.      TED hose   Complete by: As directed    Use stockings (TED hose) for three weeks on both leg(s).  You may remove them at night for sleeping.   Weight bearing as tolerated   Complete by: As directed       Allergies as of 08/05/2020       Reactions   Cataflam [diclofenac Potassium] Anaphylaxis   Sulfa Antibiotics Hives         Medication List     STOP taking these medications    aspirin EC 81 MG tablet   meloxicam 15 MG tablet Commonly known as: MOBIC       TAKE these medications    albuterol 108 (90 Base) MCG/ACT inhaler Commonly known as: VENTOLIN HFA Inhale 1-2 puffs into the lungs every 6 (six) hours as needed for wheezing or shortness of breath.   CALCIUM PO Take 1 tablet by mouth daily.   gabapentin 300 MG capsule Commonly known as: NEURONTIN Take 1 capsule 3 times a day for 2 weeks following surgery. Then take 1 capsule twice daily for 2 weeks. Then take 1 capsule once daily for 2 weeks. Then discontinue.   levothyroxine 137 MCG tablet Commonly known as: SYNTHROID Take 137 mcg by mouth daily.   losartan 50 MG tablet Commonly known as: COZAAR Take 50 mg by mouth daily.   methocarbamol 500 MG tablet Commonly known as: ROBAXIN Take 1 tablet (500 mg total) by mouth every 6 (six) hours as needed for muscle spasms.   oxyCODONE 5 MG immediate release tablet Commonly known as: Oxy IR/ROXICODONE Take 1-2 tablets (5-10 mg total) by mouth every 6 hours as needed for severe pain.   pantoprazole 40 MG tablet Commonly known as: PROTONIX Take 40 mg by mouth daily.   PRESERVISION AREDS 2 PO Take 1 tablet by mouth in the morning and at bedtime.   rosuvastatin 20 MG tablet Commonly known as: CRESTOR Take 20 mg by mouth at bedtime.   sertraline 100 MG tablet Commonly known as: ZOLOFT Take 100 mg by mouth daily.   traMADol 50 MG tablet Commonly known as: ULTRAM Take 1-2 tablets (50-100 mg total) by mouth every 6 (six) hours as needed for moderate pain.   Vitamin D3 50 MCG (2000 UT) Tabs Take 2,000 Units by mouth daily.   Xarelto 10 MG  Tabs tablet Generic drug: rivaroxaban Take 1 tablet (10 mg total) by mouth daily with breakfast. Continue for 20 days until prescription is finished. Then take one 81 mg aspirin once a day for three weeks. Then discontinue aspirin.   zolpidem 10  MG tablet Commonly known as: AMBIEN Take 10 mg by mouth at bedtime as needed for sleep.               Discharge Care Instructions  (From admission, onward)           Start     Ordered   08/05/20 0000  Weight bearing as tolerated        08/05/20 0759   08/05/20 0000  Change dressing       Comments: You may remove the bulky bandage (ACE wrap and gauze) two days after surgery. You will have an adhesive waterproof bandage underneath. Leave this in place until your first follow-up appointment.   08/05/20 0759            Follow-up Information     Ollen Gross, MD Follow up.   Specialty: Orthopedic Surgery Why: You have a follow-up appointment scheduled on 08/16/2020 at 1:30 pm Contact information: [address] [place] [address] [place] [address] [phone]                 Signed: Nelia Shi, MBA, PA-C Orthopedic Surgery 08/17/2020, 9:32 AM

## 2022-06-27 IMAGING — NM NM BONE 3 PHASE
10 series · 20 of 20 positions shown · non-contrast
Comparison: None

Correlation: CT RIGHT knee 02/16/2020

CLINICAL DATA: RIGHT knee replacement surgery in 8065 revised in
6865, pain and swelling RIGHT knee, LEFT knee pain

EXAM:
NUCLEAR MEDICINE 3-PHASE BONE SCAN
TECHNIQUE: Radionuclide angiographic images, immediate static blood pool
images, and 3-hour delayed static images were obtained of the knees
after intravenous injection of radiopharmaceutical.
RADIOPHARMACEUTICALS:  20.3 mCi Vc-00m MDP IV

[Series 1: flow · 2.07mm/px · 6 of 48 frames shown (1 of 2)]
[frame 5/48]
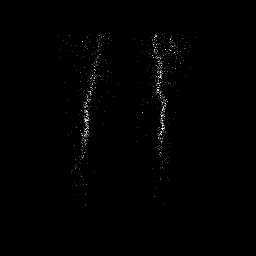
[frame 13/48  full-range]
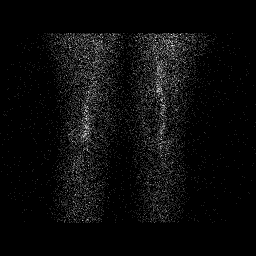
[frame 21/48  full-range]
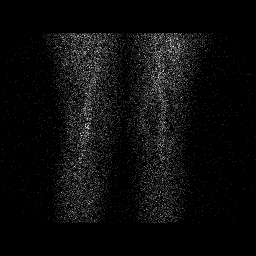
[frame 29/48  full-range]
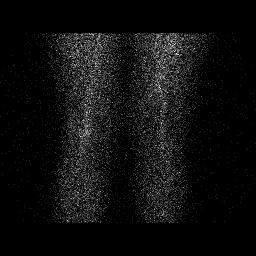
[frame 37/48  full-range]
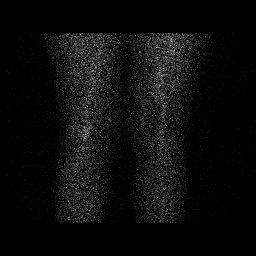
[frame 45/48  full-range]
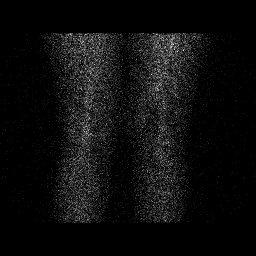

[Series 1: flow · 2.07mm/px · 6 of 48 frames shown (2 of 2)]
[frame 5/48]
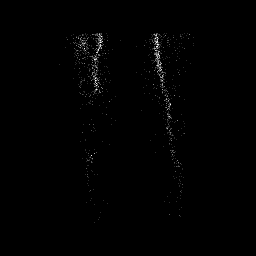
[frame 13/48  full-range]
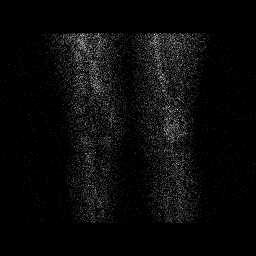
[frame 21/48  full-range]
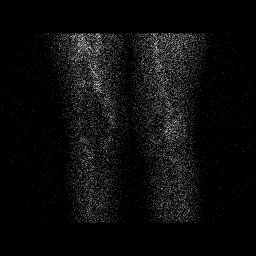
[frame 29/48  full-range]
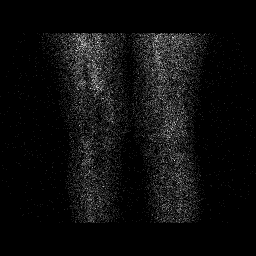
[frame 37/48  full-range]
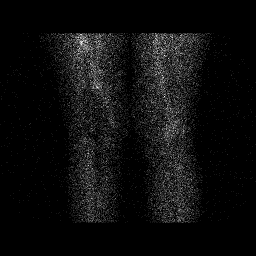
[frame 45/48  full-range]
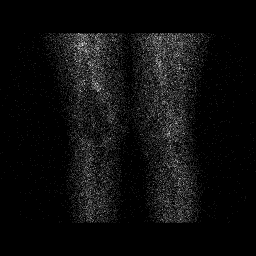

[Series 2: blood pool · 2.07mm/px · 1 of 1 slices shown (1 of 2)]
[im 1/1  full-range]
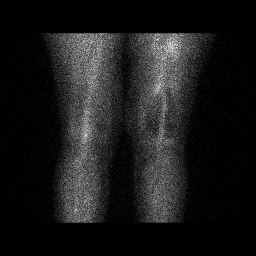

[Series 2: blood pool · 2.07mm/px · 1 of 1 slices shown (2 of 2)]
[im 1/1]
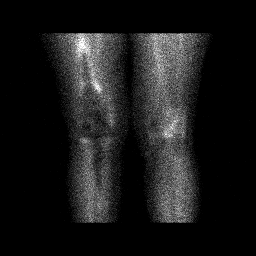

[Series 3: lat bp · 2.07mm/px · 1 of 1 slices shown (1 of 2)]
[im 1/1]
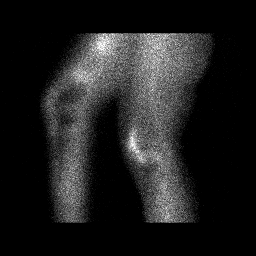

[Series 3: lat bp · 2.07mm/px · 1 of 1 slices shown (2 of 2)]
[im 1/1]
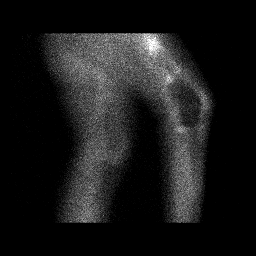

[Series 4: delay · delayed · 2.07mm/px · 1 of 1 slices shown (1 of 4)]
[im 1/1]
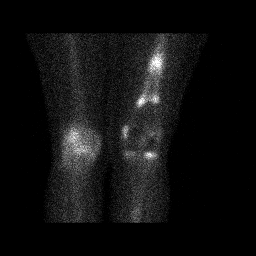

[Series 4: delay · delayed · 2.07mm/px · 1 of 1 slices shown (2 of 4)]
[im 1/1]
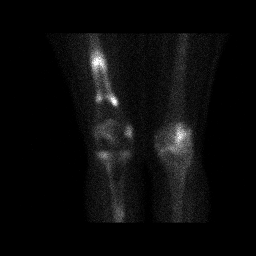

[Series 5: delay · delayed · 2.07mm/px · 1 of 1 slices shown (3 of 4)]
[im 1/1]
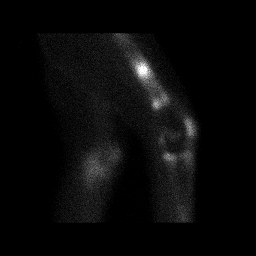

[Series 5: delay · delayed · 2.07mm/px · 1 of 1 slices shown (4 of 4)]
[im 1/1]
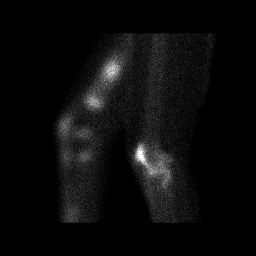

[20 of 20 positions shown; findings below may reference images not displayed]

FINDINGS: Vascular phase: Mildly increased blood flow to peripatellar region
of LEFT knee. No abnormal blood flow RIGHT knee.

Blood pool phase: Increased blood pool at LEFT knee joint as well as
adjacent to the femoral stem of the RIGHT knee prosthesis

Delayed phase: Uptake at LEFT knee especially patellofemoral joint
consistent with degenerative changes. On RIGHT, photopenic defects
are seen from long stemmed femoral and tibial components of a RIGHT
knee prosthesis. Mild focal uptake of tracer at the lateral tibial
plateau and at the tip of the tibial component of the RIGHT knee
prosthesis consistent with aseptic loosening. Uptake is also seen at
the tip of the femoral component of the prosthesis concerning for
loosening. Additional uptake is seen at site of the distal RIGHT
femoral metadiaphyseal fracture. Absent tracer uptake in distal
RIGHT femoral metadiaphysis/metaphysis corresponding to the majority
of the distal femoral fragment on CT, raising question of
devascularized bone.
IMPRESSION: Suspected loosening of both the tibial and femoral components of the
RIGHT knee prosthesis.

Absent uptake of tracer within the large distal RIGHT femoral
fragment at metadiaphysis/metaphysis question de vascularized
segment of bone.

Mild degenerative type uptake of tracer at LEFT knee.

These results will be called to the ordering clinician or
representative by the Radiologist Assistant, and communication
documented in the PACS or [REDACTED].

## 2023-11-10 ENCOUNTER — Ambulatory Visit
Admission: EM | Admit: 2023-11-10 | Discharge: 2023-11-10 | Disposition: A | Discharge status: Home / Self Care | Attending: Family Medicine | Admitting: Family Medicine

## 2023-11-10 DIAGNOSIS — B9789 Other viral agents as the cause of diseases classified elsewhere: Secondary | ICD-10-CM

## 2023-11-10 DIAGNOSIS — J988 Other specified respiratory disorders: Secondary | ICD-10-CM | POA: Diagnosis not present

## 2023-11-10 DIAGNOSIS — J4530 Mild persistent asthma, uncomplicated: Secondary | ICD-10-CM

## 2023-11-10 DIAGNOSIS — J309 Allergic rhinitis, unspecified: Secondary | ICD-10-CM

## 2023-11-10 MED ORDER — PREDNISONE 10 MG PO TABS: 30.0000 mg | ORAL_TABLET | Freq: Every day | ORAL | 0 refills | Status: DC

## 2023-11-10 NOTE — Discharge Instructions (Addendum)
 We will manage this as a viral respiratory infection. For sore throat or cough try using a honey-based tea. Use 3 teaspoons of honey with juice squeezed from half lemon. Place shaved pieces of ginger into 1/2-1 cup of water  and warm over stove top. Then mix the ingredients and repeat every 4 hours as needed. Please take Tylenol  500mg -650mg  once every 6 hours for fevers, aches and pains. Hydrate very well with at least 2 liters (64 ounces) of water . Eat light meals such as soups (chicken and noodles, chicken wild rice, vegetable).  Do not eat any foods that you are allergic to.  Start an antihistamine like Zyrtec (10mg  daily) for postnasal drainage, sinus congestion.  You can take this together with prednisone as a respiratory boost.

## 2023-11-10 NOTE — ED Triage Notes (Signed)
 Melissa Irwin reports lethargic that started Thursday, Friday, cough, headache, sneezing and continued lethargic. Treated with OTC Coricidin, symptoms went away Saturday and resumed today.

## 2023-11-10 NOTE — ED Provider Notes (Signed)
 [facility]  Note:  This document was prepared using Conservation officer, historic buildings and may include unintentional dictation errors.  MRN: [id number] DOB: 1945-01-22  Subjective:   Melissa Irwin is a 78 y.o. female presenting for 4 day history of sinus headaches, sinus pressure, coughing, sneezing, malaise and fatigue. No fever, chest pain, shob, wheezing. Has asthma and allergies. Uses an inhaler as needed but has not used this illness.  No smoking of any kind including cigarettes, cigars, vaping, marijuana use.    No current facility-administered medications for this encounter.  Current Outpatient Medications:    albuterol  (VENTOLIN  HFA) 108 (90 Base) MCG/ACT inhaler, Inhale 1-2 puffs into the lungs every 6 (six) hours as needed for wheezing or shortness of breath., Disp: , Rfl:    CALCIUM  PO, Take 1 tablet by mouth daily., Disp: , Rfl:    Cholecalciferol (VITAMIN D3) 50 MCG (2000 UT) TABS, Take 2,000 Units by mouth daily., Disp: , Rfl:    gabapentin  (NEURONTIN ) 300 MG capsule, Take 1 capsule 3 times a day for 2 weeks following surgery. Then take 1 capsule twice daily for 2 weeks. Then take 1 capsule once daily for 2 weeks. Then discontinue., Disp: 84 capsule, Rfl: 0   levothyroxine  (SYNTHROID ) 137 MCG tablet, Take 137 mcg by mouth daily., Disp: , Rfl:    losartan  (COZAAR ) 50 MG tablet, Take 50 mg by mouth daily., Disp: , Rfl:    methocarbamol  (ROBAXIN ) 500 MG tablet, Take 1 tablet (500 mg total) by mouth every 6 (six) hours as needed for muscle spasms., Disp: 40 tablet, Rfl: 0   Multiple Vitamins-Minerals (PRESERVISION AREDS 2 PO), Take 1 tablet by mouth in the morning and at bedtime., Disp: , Rfl:    oxyCODONE  (OXY IR/ROXICODONE ) 5 MG immediate release tablet, Take 1-2 tablets (5-10 mg total) by mouth every 6 hours as needed for severe pain., Disp: 42 tablet, Rfl: 0   pantoprazole  (PROTONIX ) 40 MG tablet, Take 40 mg by mouth daily., Disp: , Rfl:     rivaroxaban  (XARELTO ) 10 MG TABS tablet, Take 1 tablet (10 mg total) by mouth daily with breakfast. Continue for 20 days until prescription is finished. Then take one 81 mg aspirin once a day for three weeks. Then discontinue aspirin., Disp: 20 tablet, Rfl: 0   rosuvastatin  (CRESTOR ) 20 MG tablet, Take 20 mg by mouth at bedtime., Disp: , Rfl:    sertraline  (ZOLOFT ) 100 MG tablet, Take 100 mg by mouth daily., Disp: , Rfl:    traMADol  (ULTRAM ) 50 MG tablet, Take 1-2 tablets (50-100 mg total) by mouth every 6 (six) hours as needed for moderate pain., Disp: 40 tablet, Rfl: 0   zolpidem  (AMBIEN ) 10 MG tablet, Take 10 mg by mouth at bedtime as needed for sleep., Disp: , Rfl:    Allergies  Allergen Reactions   Cataflam [Diclofenac Potassium] Anaphylaxis   Sulfa Antibiotics Hives    Past Medical History:  Diagnosis Date   Arthritis    Asthma    Complication of anesthesia    Hypothyroidism    PONV (postoperative nausea and vomiting)      Past Surgical History:  Procedure Laterality Date   CHOLECYSTECTOMY     NASAL SINUS SURGERY     x 2   right elbow surgery      right hip fracture surgery      right knee replacement      x 2   right shoulder replacement  TOTAL KNEE REVISION Right 08/03/2020   Procedure: TOTAL KNEE REVISION;  Surgeon: Melodi Lerner, MD;  Location: [facility];  Service: Orthopedics;  Laterality: Right;     No family history on file.  Social History   Tobacco Use   Smoking status: Former    Current packs/day: 0.00    Types: Cigarettes    Quit date: 01/16/1968    Years since quitting: 55.8   Smokeless tobacco: Never  Vaping Use   Vaping status: Never Used  Substance Use Topics   Alcohol use: Yes    Comment: social   Drug use: Never    ROS   Objective:   Vitals: BP 124/84 (BP Location: Left Arm)   Pulse 75   Temp 98.3 F (36.8 C) (Oral)   Ht 5' 3 (1.6 m)   Wt 130 lb (59 kg)   SpO2 95%   BMI 23.03 kg/m   Physical Exam Constitutional:       General: She is not in acute distress.    Appearance: Normal appearance. She is well-developed and normal weight. She is not ill-appearing, toxic-appearing or diaphoretic.  HENT:     Head: Normocephalic and atraumatic.     Right Ear: Tympanic membrane, ear canal and external ear normal. No drainage or tenderness. No middle ear effusion. There is no impacted cerumen. Tympanic membrane is not erythematous or bulging.     Left Ear: Tympanic membrane, ear canal and external ear normal. No drainage or tenderness.  No middle ear effusion. There is no impacted cerumen. Tympanic membrane is not erythematous or bulging.     Nose: Nose normal. No congestion or rhinorrhea.     Mouth/Throat:     Mouth: Mucous membranes are moist. No oral lesions.     Pharynx: No pharyngeal swelling, oropharyngeal exudate, posterior oropharyngeal erythema or uvula swelling.     Tonsils: No tonsillar exudate or tonsillar abscesses.  Eyes:     General: No scleral icterus.       Right eye: No discharge.        Left eye: No discharge.     Extraocular Movements: Extraocular movements intact.     Right eye: Normal extraocular motion.     Left eye: Normal extraocular motion.     Conjunctiva/sclera: Conjunctivae normal.  Cardiovascular:     Rate and Rhythm: Normal rate and regular rhythm.     Heart sounds: Normal heart sounds. No murmur heard.    No friction rub. No gallop.  Pulmonary:     Effort: Pulmonary effort is normal. No respiratory distress.     Breath sounds: No stridor. No wheezing, rhonchi or rales.  Chest:     Chest wall: No tenderness.  Musculoskeletal:     Cervical back: Normal range of motion and neck supple.  Lymphadenopathy:     Cervical: No cervical adenopathy.  Skin:    General: Skin is warm and dry.  Neurological:     General: No focal deficit present.     Mental Status: She is alert and oriented to person, place, and time.  Psychiatric:        Mood and Affect: Mood normal.        Behavior:  Behavior normal.    Assessment and Plan :   PDMP not reviewed this encounter.  1. Viral respiratory infection   2. Allergic rhinitis, unspecified seasonality, unspecified trigger   3. Mild persistent asthma, uncomplicated    Deferred imaging given clear cardiopulmonary exam, hemodynamically stable vital signs.  Patient requested aggressive management as she likes to have a busy lifestyle.  I offered an oral prednisone course in the context of her allergic rhinitis and asthma.  She has typically done very well with this.  Otherwise recommend supportive care for a viral respiratory infection.  Counseled patient on potential for adverse effects with medications prescribed/recommended today, ER and return-to-clinic precautions discussed, patient verbalized understanding.    Christopher Savannah, PA-C 11/10/23 1357

## 2024-01-20 ENCOUNTER — Ambulatory Visit
Admission: EM | Admit: 2024-01-20 | Discharge: 2024-01-20 | Disposition: A | Discharge status: Home / Self Care | Attending: Family Medicine | Admitting: Family Medicine

## 2024-01-20 ENCOUNTER — Encounter: Payer: Self-pay | Admitting: Emergency Medicine

## 2024-01-20 DIAGNOSIS — R110 Nausea: Secondary | ICD-10-CM | POA: Diagnosis not present

## 2024-01-20 DIAGNOSIS — J4521 Mild intermittent asthma with (acute) exacerbation: Secondary | ICD-10-CM | POA: Diagnosis not present

## 2024-01-20 DIAGNOSIS — J069 Acute upper respiratory infection, unspecified: Secondary | ICD-10-CM

## 2024-01-20 MED ORDER — PREDNISONE 20 MG PO TABS: 20.0000 mg | ORAL_TABLET | Freq: Every day | ORAL | 0 refills | Status: AC

## 2024-01-20 MED ORDER — ONDANSETRON HCL 4 MG/2ML IJ SOLN
4.0000 mg | Freq: Once | INTRAMUSCULAR | Status: AC
  Administered 2024-01-20: 4 mg via INTRAMUSCULAR

## 2024-01-20 MED ORDER — BENZONATATE 100 MG PO CAPS: 100.0000 mg | ORAL_CAPSULE | Freq: Three times a day (TID) | ORAL | 0 refills | Status: AC

## 2024-01-20 MED ORDER — ONDANSETRON 4 MG PO TBDP: 4.0000 mg | ORAL_TABLET | Freq: Three times a day (TID) | ORAL | 0 refills | Status: AC | PRN

## 2024-01-20 NOTE — ED Provider Notes (Addendum)
 " [facility]    CSN: [id number] Arrival date & time: 01/20/24  1118      History   Chief Complaint Chief Complaint  Patient presents with   Nausea   Cough    HPI Melissa Irwin is a 79 y.o. female  presents for evaluation of URI symptoms for 7 days. Patient reports associated symptoms of cough, congestion, nausea without vomiting, wheezing, fatigue.  Ports had a headache but this since resolved.  Denies diarrhea, sore throat, fevers, body aches or shortness of breath. Patient does have a hx of asthma.  Has an albuterol  inhaler, last used 2 days ago.  Patient is not an active smoker.   Reports sick contacts via partner.  Pt has taken cold medicine OTC for symptoms.  Patient primarily wants something for her nausea.  She does states she got the COVID-vaccine on the day the symptoms started.  States she felt like she was coming down with something prior to getting the COVID shot.  She has had the COVID-vaccine in the past without the symptoms.  Pt has no other concerns at this time.    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      Home Medications    Prior to Admission medications  Medication Sig  Start Date End Date Taking? Authorizing Provider  benzonatate  (TESSALON ) 100 MG capsule Take 1 capsule (100 mg total) by mouth every 8 (eight) hours. 01/20/24  Yes Akiel Fennell, Jodi R, NP  ondansetron  (ZOFRAN -ODT) 4 MG disintegrating tablet Take 1 tablet (4 mg total) by mouth every 8 (eight) hours as needed for nausea or vomiting. 01/20/24  Yes Gianlucas Evenson, Jodi R, NP  predniSONE  (DELTASONE ) 20 MG tablet Take 1 tablet (20 mg total) by mouth daily with breakfast for 5 days. 01/20/24 01/25/24 Yes Madelina Sanda, Jodi R, NP  albuterol  (VENTOLIN  HFA) 108 (90 Base) MCG/ACT inhaler Inhale 1-2 puffs into the lungs every 6 (six) hours as needed for wheezing or shortness of breath.    [provider]  CALCIUM  PO Take 1 tablet by mouth daily.    [provider]  Cholecalciferol (VITAMIN D3) 50 MCG (2000 UT) TABS Take 2,000 Units by mouth daily.    [provider]  gabapentin  (NEURONTIN ) 300 MG capsule Take 1 capsule 3 times a day for 2 weeks following surgery. Then take 1 capsule twice daily for 2 weeks. Then take 1 capsule once daily for 2 weeks. Then discontinue. 08/05/20   Arnaldo Mallick D, PA-C  levothyroxine  (SYNTHROID ) 137 MCG tablet  Take 137 mcg by mouth daily. 06/27/20   [provider]  losartan  (COZAAR ) 50 MG tablet Take 50 mg by mouth daily. 06/08/20   [provider]  methocarbamol  (ROBAXIN ) 500 MG tablet Take 1 tablet (500 mg total) by mouth every 6 (six) hours as needed for muscle spasms. 08/05/20   Arnaldo Mallick D, PA-C  Multiple Vitamins-Minerals (PRESERVISION AREDS 2 PO) Take 1 tablet by mouth in the morning and at bedtime.    [provider]  oxyCODONE  (OXY IR/ROXICODONE ) 5 MG immediate release tablet Take 1-2 tablets (5-10 mg total) by mouth every 6 hours as needed for severe pain. 08/05/20   Arnaldo Mallick D, PA-C  pantoprazole  (PROTONIX ) 40 MG tablet Take 40 mg by mouth daily. 06/08/20   [provider]  rivaroxaban  (XARELTO ) 10 MG TABS tablet Take 1 tablet  (10 mg total) by mouth daily with breakfast. Continue for 20 days until prescription is finished. Then take one 81 mg aspirin once a day for three weeks. Then discontinue aspirin. 08/05/20   Arnaldo Mallick D, PA-C  rosuvastatin  (CRESTOR ) 20 MG tablet Take 20 mg by mouth at bedtime. 07/17/20   [provider]  sertraline  (ZOLOFT ) 100 MG tablet Take 100 mg by mouth daily. 06/06/20   [provider]  traMADol  (ULTRAM ) 50 MG tablet Take 1-2 tablets (50-100 mg total) by mouth every 6 (six) hours as needed for moderate pain. 08/05/20   Childress, Sean D, PA-C  zolpidem  (AMBIEN ) 10 MG tablet Take 10 mg by mouth at bedtime as needed for sleep. 07/25/20   [provider]    Family History No family history on file.  Social History Social History[1]   Allergies   Cataflam [diclofenac potassium] and Sulfa antibiotics   Review of Systems Review of Systems  Constitutional:  Positive for fatigue.  HENT:  Positive for congestion.   Respiratory:  Positive for cough and wheezing.   Gastrointestinal:  Positive for nausea.     Physical Exam Triage Vital Signs ED Triage Vitals  Encounter Vitals Group     BP 01/20/24 1215 133/73     Girls Systolic BP Percentile --      Girls Diastolic BP Percentile --      Boys Systolic BP Percentile --      Boys Diastolic BP Percentile --      Pulse Rate 01/20/24 1215 80     Resp 01/20/24 1215 15     Temp 01/20/24 1215 98.1 F (36.7 C)     Temp Source 01/20/24 1215 Oral     SpO2 01/20/24 1215 97 %     Weight 01/20/24 1220 128 lb (58.1 kg)     Height 01/20/24 1220 5' 4 (1.626 m)     Head Circumference --      Peak Flow --      Pain Score 01/20/24 1220 0     Pain Loc --      Pain Education --      Exclude from Growth Chart --    No data found.  Updated Vital Signs BP 133/73 (BP Location: Left Arm)   Pulse 80   Temp 98.1 F (36.7 C) (Oral)   Resp 15   Ht 5' 4 (1.626 m)   Wt 128 lb (58.1 kg)   SpO2 97%   BMI 21.97 kg/m    Visual Acuity Right Eye Distance:   Left Eye Distance:   Bilateral Distance:    Right Eye Near:   Left Eye Near:  Bilateral Near:     Physical Exam Vitals and nursing note reviewed.  Constitutional:      General: She is not in acute distress.    Appearance: She is well-developed. She is not ill-appearing.  HENT:     Head: Normocephalic and atraumatic.     Right Ear: Tympanic membrane and ear canal normal.     Left Ear: Tympanic membrane and ear canal normal.     Nose: Congestion present.     Mouth/Throat:     Mouth: Mucous membranes are moist.     Pharynx: Oropharynx is clear. Uvula midline. No oropharyngeal exudate or posterior oropharyngeal erythema.     Tonsils: No tonsillar exudate or tonsillar abscesses.  Eyes:     Conjunctiva/sclera: Conjunctivae normal.     Pupils: Pupils are equal, round, and reactive to light.  Cardiovascular:     Rate and Rhythm: Normal rate and regular rhythm.     Heart sounds: Normal heart sounds.  Pulmonary:     Effort: Pulmonary effort is normal.     Breath sounds: Normal breath sounds. No wheezing, rhonchi or rales.  Musculoskeletal:     Cervical back: Normal range of motion and neck supple.  Lymphadenopathy:     Cervical: No cervical adenopathy.  Skin:    General: Skin is warm and dry.  Neurological:     General: No focal deficit present.     Mental Status: She is alert and oriented to person, place, and time.  Psychiatric:        Mood and Affect: Mood normal.        Behavior: Behavior normal.      UC Treatments / Results  Labs (all labs ordered are listed, but only abnormal results are displayed) Labs Reviewed - No data to display Comprehensive Metabolic Panel Order: [id number] Component Ref Range & Units 7 mo ago  Sodium 136 - 145 mmol/L 136  Potassium 3.5 - 5.1 mmol/L 4.0  Comment: NO VISIBLE HEMOLYSIS  Chloride 98 - 107 mmol/L 106  CO2 21 - 31 mmol/L 26  Anion Gap 6 - 14 mmol/L 4 Low   Glucose, Random 70 -  99 mg/dL 87  Blood Urea Nitrogen (BUN) 7 - 25 mg/dL 20  Creatinine 9.39 - 8.79 mg/dL 9.45 Low   eGFR >40 fO/fpw/8.26f7 >90  Comment: GFR estimated by CKD-EPI equations(NKF 2021).  Recommend confirmation of Cr-based eGFR by using Cys-based eGFR and other filtration markers (if applicable) in complex cases and clinical decision-making, as needed.  Albumin 3.5 - 5.7 g/dL 4.2  Total Protein 6.4 - 8.9 g/dL 7.4  Bilirubin, Total 0.3 - 1.0 mg/dL 0.7  Alkaline Phosphatase (ALP) 34 - 104 U/L 81  Aspartate Aminotransferase (AST) 13 - 39 U/L 19  Alanine Aminotransferase (ALT) 7 - 52 U/L 12  Calcium  8.6 - 10.3 mg/dL 9.0  BUN/Creatinine Ratio 10.0 - 20.0 37.0 High   Comment: Potential prerenal damage (e.g. CHF, GI Bleeding)  Resulting Agency [facility](CLIA# [id number])   Specimen Collected: 06/19/23 15:10   Performed by: [facility](CLIA# [id number]) Last Resulted: 06/19/23 16:17  Received From: [facility]  Result Received: 11/10/23 12:48   EKG   Radiology No results found.  Procedures Procedures (including critical care time)  Medications Ordered in UC Medications  ondansetron  (ZOFRAN ) injection 4 mg (4 mg Intramuscular Given 01/20/24 1233)    Initial Impression / Assessment and Plan / UC Course  I have reviewed the triage vital signs  and the nursing notes.  Pertinent labs & imaging results that were available during my care of the patient were reviewed by me and considered in my medical decision making (see chart for details).     Reviewed exam and symptoms with patient.  No red flags.  She is well-appearing.  Discussed viral upper respiratory illness/asthma exacerbation.  ODT not available in clinic at time of evaluation, patient given IM Zofran  for nausea and Rx of ODT Zofran  sent to pharmacy.  Tessalon  as needed for cough.  Also do prednisone  daily for 5 days for asthma symptoms.  She is to continue her inhaler  as needed.  Encourage rest fluids and PCP follow-up 2 to 3 days for recheck.  ER precautions reviewed. Final Clinical Impressions(s) / UC Diagnoses   Final diagnoses:  Nausea without vomiting  Mild intermittent asthma with acute exacerbation  Viral upper respiratory illness     Discharge Instructions      You were given a shot of a nausea medication Zofran  while in the clinic.  A dissolving tablet has been sent to your pharmacy and you may take this every 8 hours as needed for nausea and/or vomiting.  Start prednisone  daily for 5 days for your asthma symptoms.  Continue your inhaler as needed.  You may take Tessalon  3 times a day as needed for your cough.  Lots of rest and fluids and follow-up with your PCP in 2 to 3 days for recheck.  Please go to the emergency room for any worsening symptoms.  Hope you feel better soon!    ED Prescriptions     Medication Sig Dispense Auth. Provider   predniSONE  (DELTASONE ) 20 MG tablet Take 1 tablet (20 mg total) by mouth daily with breakfast for 5 days. 5 tablet Calton Harshfield, Jodi R, NP   benzonatate  (TESSALON ) 100 MG capsule Take 1 capsule (100 mg total) by mouth every 8 (eight) hours. 21 capsule Jakeline Dave, Jodi R, NP   ondansetron  (ZOFRAN -ODT) 4 MG disintegrating tablet Take 1 tablet (4 mg total) by mouth every 8 (eight) hours as needed for nausea or vomiting. 10 tablet Rhandi Despain, Jodi R, NP      PDMP not reviewed this encounter.    Loreda Myla SAUNDERS, NP 01/20/24 1235     [1]  Social History Tobacco Use   Smoking status: Former    Current packs/day: 0.00    Types: Cigarettes    Quit date: 01/16/1968    Years since quitting: 56.0   Smokeless tobacco: Never  Vaping Use   Vaping status: Never Used  Substance Use Topics   Alcohol use: Yes    Comment: social   Drug use: Never     Loreda Myla SAUNDERS, NP 01/20/24 1235  "

## 2024-01-20 NOTE — ED Triage Notes (Signed)
 Pt st's she had a Covid vaccine last Mon and has had covid type symptoms since then,  Nausea, dry cough, has had a headache but it has subsided at this time.  Also c/o feeling fatigued

## 2024-01-20 NOTE — Discharge Instructions (Addendum)
 You were given a shot of a nausea medication Zofran  while in the clinic.  A dissolving tablet has been sent to your pharmacy and you may take this every 8 hours as needed for nausea and/or vomiting.  Start prednisone  daily for 5 days for your asthma symptoms.  Continue your inhaler as needed.  You may take Tessalon  3 times a day as needed for your cough.  Lots of rest and fluids and follow-up with your PCP in 2 to 3 days for recheck.  Please go to the emergency room for any worsening symptoms.  Hope you feel better soon!

## 2024-02-14 ENCOUNTER — Other Ambulatory Visit: Payer: Self-pay | Admitting: Orthopaedic Surgery

## 2024-02-14 DIAGNOSIS — M2021 Hallux rigidus, right foot: Secondary | ICD-10-CM

## 2024-03-04 ENCOUNTER — Other Ambulatory Visit: Payer: PRIVATE HEALTH INSURANCE

## 2024-03-25 ENCOUNTER — Encounter (HOSPITAL_BASED_OUTPATIENT_CLINIC_OR_DEPARTMENT_OTHER): Payer: Self-pay

## 2024-03-25 ENCOUNTER — Ambulatory Visit (HOSPITAL_BASED_OUTPATIENT_CLINIC_OR_DEPARTMENT_OTHER): Admit: 2024-03-25 | Payer: PRIVATE HEALTH INSURANCE | Admitting: Orthopaedic Surgery
